# Patient Record
Sex: Female | Born: 1935 | Race: White | Hispanic: No | Marital: Married | Smoking: Never smoker
Health system: Southern US, Community
[De-identification: ages and names within clinical notes are randomized; demographics above are authoritative.]

## PROBLEM LIST (undated history)

## (undated) DIAGNOSIS — E039 Hypothyroidism, unspecified: Secondary | ICD-10-CM

## (undated) DIAGNOSIS — I209 Angina pectoris, unspecified: Secondary | ICD-10-CM

## (undated) DIAGNOSIS — A809 Acute poliomyelitis, unspecified: Secondary | ICD-10-CM

## (undated) DIAGNOSIS — T8859XA Other complications of anesthesia, initial encounter: Secondary | ICD-10-CM

## (undated) DIAGNOSIS — M1990 Unspecified osteoarthritis, unspecified site: Secondary | ICD-10-CM

## (undated) DIAGNOSIS — M940 Chondrocostal junction syndrome [Tietze]: Secondary | ICD-10-CM

## (undated) DIAGNOSIS — C801 Malignant (primary) neoplasm, unspecified: Secondary | ICD-10-CM

## (undated) DIAGNOSIS — Z923 Personal history of irradiation: Secondary | ICD-10-CM

## (undated) DIAGNOSIS — M6289 Other specified disorders of muscle: Secondary | ICD-10-CM

## (undated) DIAGNOSIS — T4145XA Adverse effect of unspecified anesthetic, initial encounter: Secondary | ICD-10-CM

## (undated) DIAGNOSIS — G4750 Parasomnia, unspecified: Secondary | ICD-10-CM

## (undated) DIAGNOSIS — H269 Unspecified cataract: Secondary | ICD-10-CM

## (undated) DIAGNOSIS — F419 Anxiety disorder, unspecified: Secondary | ICD-10-CM

## (undated) HISTORY — PX: CATARACT EXTRACTION: SUR2

## (undated) HISTORY — PX: ABDOMINAL HYSTERECTOMY: SHX81

## (undated) HISTORY — PX: BREAST SURGERY: SHX581

## (undated) HISTORY — DX: Acute poliomyelitis, unspecified: A80.9

## (undated) HISTORY — PX: OTHER SURGICAL HISTORY: SHX169

## (undated) HISTORY — DX: Other specified disorders of muscle: M62.89

## (undated) HISTORY — DX: Personal history of irradiation: Z92.3

## (undated) HISTORY — DX: Anxiety disorder, unspecified: F41.9

## (undated) HISTORY — PX: TONSILLECTOMY: SUR1361

## (undated) HISTORY — DX: Unspecified cataract: H26.9

## (undated) HISTORY — PX: EYE SURGERY: SHX253

## (undated) HISTORY — DX: Parasomnia, unspecified: G47.50

---
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
## 1986-12-01 HISTORY — PX: LEG SURGERY: SHX1003

## 1998-06-15 ENCOUNTER — Ambulatory Visit (HOSPITAL_COMMUNITY): Admission: RE | Admit: 1998-06-15 | Discharge: 1998-06-15 | Payer: Self-pay | Admitting: Obstetrics & Gynecology

## 1998-09-18 ENCOUNTER — Ambulatory Visit (HOSPITAL_COMMUNITY): Admission: RE | Admit: 1998-09-18 | Discharge: 1998-09-18 | Payer: Self-pay | Admitting: Family Medicine

## 1998-10-09 ENCOUNTER — Other Ambulatory Visit: Admission: RE | Admit: 1998-10-09 | Discharge: 1998-10-09 | Payer: Self-pay | Admitting: Obstetrics and Gynecology

## 1999-01-08 ENCOUNTER — Ambulatory Visit (HOSPITAL_BASED_OUTPATIENT_CLINIC_OR_DEPARTMENT_OTHER): Admission: RE | Admit: 1999-01-08 | Discharge: 1999-01-08 | Payer: Self-pay | Admitting: Orthopaedic Surgery

## 1999-01-18 ENCOUNTER — Encounter: Admission: RE | Admit: 1999-01-18 | Discharge: 1999-03-25 | Payer: Self-pay | Admitting: Orthopaedic Surgery

## 1999-09-23 ENCOUNTER — Ambulatory Visit (HOSPITAL_COMMUNITY): Admission: RE | Admit: 1999-09-23 | Discharge: 1999-09-23 | Payer: Self-pay | Admitting: Family Medicine

## 1999-10-09 ENCOUNTER — Other Ambulatory Visit: Admission: RE | Admit: 1999-10-09 | Discharge: 1999-10-09 | Payer: Self-pay | Admitting: Obstetrics and Gynecology

## 1999-11-06 ENCOUNTER — Encounter: Payer: Self-pay | Admitting: Gastroenterology

## 1999-11-06 ENCOUNTER — Encounter: Admission: RE | Admit: 1999-11-06 | Discharge: 1999-11-06 | Payer: Self-pay | Admitting: Gastroenterology

## 2000-03-12 ENCOUNTER — Encounter: Payer: Self-pay | Admitting: Gastroenterology

## 2000-03-12 ENCOUNTER — Ambulatory Visit (HOSPITAL_COMMUNITY): Admission: RE | Admit: 2000-03-12 | Discharge: 2000-03-12 | Payer: Self-pay | Admitting: Gastroenterology

## 2000-03-12 ENCOUNTER — Encounter (INDEPENDENT_AMBULATORY_CARE_PROVIDER_SITE_OTHER): Payer: Self-pay | Admitting: Specialist

## 2000-10-07 ENCOUNTER — Ambulatory Visit (HOSPITAL_COMMUNITY): Admission: RE | Admit: 2000-10-07 | Discharge: 2000-10-07 | Payer: Self-pay | Admitting: Family Medicine

## 2001-01-05 ENCOUNTER — Other Ambulatory Visit: Admission: RE | Admit: 2001-01-05 | Discharge: 2001-01-05 | Payer: Self-pay | Admitting: Obstetrics and Gynecology

## 2001-07-16 ENCOUNTER — Emergency Department (HOSPITAL_COMMUNITY): Admission: EM | Admit: 2001-07-16 | Discharge: 2001-07-16 | Payer: Self-pay

## 2001-10-12 ENCOUNTER — Ambulatory Visit (HOSPITAL_COMMUNITY): Admission: RE | Admit: 2001-10-12 | Discharge: 2001-10-12 | Payer: Self-pay | Admitting: Obstetrics and Gynecology

## 2001-10-12 ENCOUNTER — Encounter: Payer: Self-pay | Admitting: Obstetrics and Gynecology

## 2002-01-05 ENCOUNTER — Other Ambulatory Visit: Admission: RE | Admit: 2002-01-05 | Discharge: 2002-01-05 | Payer: Self-pay | Admitting: Obstetrics and Gynecology

## 2002-10-18 ENCOUNTER — Encounter: Payer: Self-pay | Admitting: Obstetrics and Gynecology

## 2002-10-18 ENCOUNTER — Ambulatory Visit (HOSPITAL_COMMUNITY): Admission: RE | Admit: 2002-10-18 | Discharge: 2002-10-18 | Payer: Self-pay | Admitting: Obstetrics and Gynecology

## 2003-02-15 ENCOUNTER — Other Ambulatory Visit: Admission: RE | Admit: 2003-02-15 | Discharge: 2003-02-15 | Payer: Self-pay | Admitting: Obstetrics and Gynecology

## 2003-09-04 ENCOUNTER — Ambulatory Visit (HOSPITAL_COMMUNITY): Admission: RE | Admit: 2003-09-04 | Discharge: 2003-09-04 | Payer: Self-pay | Admitting: Geriatric Medicine

## 2003-09-04 ENCOUNTER — Encounter: Payer: Self-pay | Admitting: Geriatric Medicine

## 2003-10-09 ENCOUNTER — Encounter: Admission: RE | Admit: 2003-10-09 | Discharge: 2003-10-09 | Payer: Self-pay | Admitting: Geriatric Medicine

## 2003-11-08 ENCOUNTER — Ambulatory Visit (HOSPITAL_COMMUNITY): Admission: RE | Admit: 2003-11-08 | Discharge: 2003-11-08 | Payer: Self-pay | Admitting: Geriatric Medicine

## 2004-02-29 ENCOUNTER — Other Ambulatory Visit: Admission: RE | Admit: 2004-02-29 | Discharge: 2004-02-29 | Payer: Self-pay | Admitting: Obstetrics and Gynecology

## 2004-11-12 ENCOUNTER — Ambulatory Visit (HOSPITAL_COMMUNITY): Admission: RE | Admit: 2004-11-12 | Discharge: 2004-11-12 | Payer: Self-pay | Admitting: Geriatric Medicine

## 2005-02-09 ENCOUNTER — Encounter: Admission: RE | Admit: 2005-02-09 | Discharge: 2005-02-09 | Payer: Self-pay | Admitting: Geriatric Medicine

## 2005-11-19 ENCOUNTER — Ambulatory Visit (HOSPITAL_COMMUNITY): Admission: RE | Admit: 2005-11-19 | Discharge: 2005-11-19 | Payer: Self-pay | Admitting: Geriatric Medicine

## 2006-05-20 ENCOUNTER — Other Ambulatory Visit: Admission: RE | Admit: 2006-05-20 | Discharge: 2006-05-20 | Payer: Self-pay | Admitting: Obstetrics and Gynecology

## 2006-12-09 ENCOUNTER — Ambulatory Visit (HOSPITAL_COMMUNITY): Admission: RE | Admit: 2006-12-09 | Discharge: 2006-12-09 | Payer: Self-pay | Admitting: Geriatric Medicine

## 2007-12-14 ENCOUNTER — Ambulatory Visit (HOSPITAL_COMMUNITY): Admission: RE | Admit: 2007-12-14 | Discharge: 2007-12-14 | Payer: Self-pay | Admitting: Obstetrics and Gynecology

## 2008-04-05 ENCOUNTER — Other Ambulatory Visit: Admission: RE | Admit: 2008-04-05 | Discharge: 2008-04-05 | Payer: Self-pay | Admitting: Obstetrics and Gynecology

## 2009-01-16 ENCOUNTER — Ambulatory Visit (HOSPITAL_COMMUNITY): Admission: RE | Admit: 2009-01-16 | Discharge: 2009-01-16 | Payer: Self-pay | Admitting: Geriatric Medicine

## 2009-05-07 ENCOUNTER — Ambulatory Visit: Payer: Self-pay | Admitting: Obstetrics and Gynecology

## 2010-01-22 ENCOUNTER — Ambulatory Visit (HOSPITAL_COMMUNITY): Admission: RE | Admit: 2010-01-22 | Discharge: 2010-01-22 | Payer: Self-pay | Admitting: Geriatric Medicine

## 2011-01-15 ENCOUNTER — Other Ambulatory Visit: Payer: Self-pay | Admitting: Obstetrics and Gynecology

## 2011-01-15 DIAGNOSIS — Z1231 Encounter for screening mammogram for malignant neoplasm of breast: Secondary | ICD-10-CM

## 2011-01-28 ENCOUNTER — Ambulatory Visit (HOSPITAL_COMMUNITY)
Admission: RE | Admit: 2011-01-28 | Discharge: 2011-01-28 | Disposition: A | Discharge status: Home / Self Care | Payer: Medicare Other | Source: Ambulatory Visit | Attending: Obstetrics and Gynecology | Admitting: Obstetrics and Gynecology

## 2011-01-28 ENCOUNTER — Encounter (HOSPITAL_COMMUNITY): Payer: Self-pay

## 2011-01-28 DIAGNOSIS — Z1231 Encounter for screening mammogram for malignant neoplasm of breast: Secondary | ICD-10-CM | POA: Insufficient documentation

## 2011-03-06 ENCOUNTER — Ambulatory Visit: Payer: PRIVATE HEALTH INSURANCE | Admitting: Physical Therapy

## 2011-03-11 ENCOUNTER — Ambulatory Visit: Payer: PRIVATE HEALTH INSURANCE | Admitting: Physical Therapy

## 2011-03-17 ENCOUNTER — Ambulatory Visit: Payer: PRIVATE HEALTH INSURANCE | Admitting: Physical Therapy

## 2012-02-09 ENCOUNTER — Other Ambulatory Visit (HOSPITAL_COMMUNITY): Payer: Self-pay | Admitting: Geriatric Medicine

## 2012-02-09 DIAGNOSIS — Z1231 Encounter for screening mammogram for malignant neoplasm of breast: Secondary | ICD-10-CM

## 2012-03-03 ENCOUNTER — Ambulatory Visit (HOSPITAL_COMMUNITY)
Admission: RE | Admit: 2012-03-03 | Discharge: 2012-03-03 | Disposition: A | Discharge status: Home / Self Care | Payer: Medicare Other | Source: Ambulatory Visit | Attending: Geriatric Medicine | Admitting: Geriatric Medicine

## 2012-03-03 DIAGNOSIS — Z1231 Encounter for screening mammogram for malignant neoplasm of breast: Secondary | ICD-10-CM | POA: Insufficient documentation

## 2013-01-27 ENCOUNTER — Other Ambulatory Visit (HOSPITAL_COMMUNITY): Payer: Self-pay | Admitting: Geriatric Medicine

## 2013-01-27 DIAGNOSIS — Z1231 Encounter for screening mammogram for malignant neoplasm of breast: Secondary | ICD-10-CM

## 2013-03-02 ENCOUNTER — Ambulatory Visit: Payer: Self-pay | Admitting: Gynecology

## 2013-03-09 ENCOUNTER — Ambulatory Visit (HOSPITAL_COMMUNITY): Payer: Medicare Other

## 2013-03-18 ENCOUNTER — Ambulatory Visit (HOSPITAL_COMMUNITY)
Admission: RE | Admit: 2013-03-18 | Discharge: 2013-03-18 | Disposition: A | Discharge status: Home / Self Care | Payer: Medicare Other | Source: Ambulatory Visit | Attending: Geriatric Medicine | Admitting: Geriatric Medicine

## 2013-03-18 DIAGNOSIS — Z1231 Encounter for screening mammogram for malignant neoplasm of breast: Secondary | ICD-10-CM | POA: Insufficient documentation

## 2013-03-23 ENCOUNTER — Ambulatory Visit (INDEPENDENT_AMBULATORY_CARE_PROVIDER_SITE_OTHER): Payer: Medicare Other | Admitting: Gynecology

## 2013-03-23 ENCOUNTER — Encounter: Payer: Self-pay | Admitting: Gynecology

## 2013-03-23 ENCOUNTER — Ambulatory Visit (INDEPENDENT_AMBULATORY_CARE_PROVIDER_SITE_OTHER): Payer: Medicare Other

## 2013-03-23 VITALS — BP 128/86 | Ht 65.0 in | Wt 168.0 lb

## 2013-03-23 DIAGNOSIS — N949 Unspecified condition associated with female genital organs and menstrual cycle: Secondary | ICD-10-CM

## 2013-03-23 DIAGNOSIS — D259 Leiomyoma of uterus, unspecified: Secondary | ICD-10-CM

## 2013-03-23 DIAGNOSIS — N83339 Acquired atrophy of ovary and fallopian tube, unspecified side: Secondary | ICD-10-CM

## 2013-03-23 DIAGNOSIS — M949 Disorder of cartilage, unspecified: Secondary | ICD-10-CM

## 2013-03-23 DIAGNOSIS — M898X8 Other specified disorders of bone, other site: Secondary | ICD-10-CM | POA: Insufficient documentation

## 2013-03-23 DIAGNOSIS — N839 Noninflammatory disorder of ovary, fallopian tube and broad ligament, unspecified: Secondary | ICD-10-CM

## 2013-03-23 DIAGNOSIS — M899 Disorder of bone, unspecified: Secondary | ICD-10-CM

## 2013-03-23 DIAGNOSIS — M8580 Other specified disorders of bone density and structure, unspecified site: Secondary | ICD-10-CM | POA: Insufficient documentation

## 2013-03-23 DIAGNOSIS — M8530 Osteitis condensans, unspecified site: Secondary | ICD-10-CM

## 2013-03-23 DIAGNOSIS — G8929 Other chronic pain: Secondary | ICD-10-CM

## 2013-03-23 DIAGNOSIS — D252 Subserosal leiomyoma of uterus: Secondary | ICD-10-CM

## 2013-03-23 DIAGNOSIS — M869 Osteomyelitis, unspecified: Secondary | ICD-10-CM

## 2013-03-23 DIAGNOSIS — N854 Malposition of uterus: Secondary | ICD-10-CM

## 2013-03-23 LAB — URINALYSIS W MICROSCOPIC + REFLEX CULTURE
Bilirubin Urine: NEGATIVE
Casts: NONE SEEN
Crystals: NONE SEEN
Glucose, UA: NEGATIVE mg/dL
Ketones, ur: NEGATIVE mg/dL
Leukocytes, UA: NEGATIVE
Nitrite: NEGATIVE
Protein, ur: NEGATIVE mg/dL
Specific Gravity, Urine: 1.025 (ref 1.005–1.030)
Urobilinogen, UA: 0.2 mg/dL (ref 0.0–1.0)
WBC, UA: NONE SEEN WBC/hpf (ref <3)
pH: 5.5 (ref 5.0–8.0)

## 2013-03-23 NOTE — Patient Instructions (Addendum)
Osteitis Pubis:  Osteitis pubis is defined as an idiopathic, inflammatory disease involving the pubic symphysis and surrounding structures.  Osteitis pubis is common among athletes but can occur in non-athletes, particularly in those with a history of rheumatologic conditions (eg, reactive arthritis, spondyloarthropathies), pregnancy, pelvic trauma, or pelvic surgery. The clinical presentation of a patient with osteitis pubis can vary but generally involves the insidious onset of pelvic pain in the absence of systemic symptoms. Osteitis pubis can be confused with osteomyelitis of the pubic symphysis. Osteomyelitis is more likely to present with acute onset of severe pain and fever.The diagnosis of osteitis pubis is often determined by history and physical examination alone. The gradual onset of pelvic discomfort, in combination with tenderness over the pubic symphysis or pain with resisted adductor testing  is sufficient for the diagnosis of osteitis pubis. Blood testing, diagnostic imaging, and biopsy are adjunctive tests that are performed when the diagnosis is uncertain after history and physical examination.The approach to management of osteitis pubis should be stepwise, going from least invasive to most invasive. In patients with osteitis pubis, we suggest initial treatment with conservative measures, including relative rest (refraining from activities that induce pelvic pain), ice, NSAIDs, and physical rehabilitation  If these conservative measures fail, we suggest administration of glucocorticoid injections of the pubic symphysis rather than an oral glucocorticoid taper  Surgical management is reserved for patients with osteitis pubis refractory to medical treatment. Curettage of the symphysis is considered to be the most effective and well-tolerated surgical treatment option.

## 2013-03-23 NOTE — Progress Notes (Signed)
Patient is an 79 year old who presented to the office today stating for the past 2-3 months she has suprapubic pain that comes and goes. She does not describe it as debilitating but improves on ambulation. She has not been seen the office for quite some time. Her primary physician is Dr. Corbin Ade. Review of patient's records indicated that 2010 she had a bone density study that demonstrated osteopenia with her lowest T score was at the [facility] with a value of -1.3. Also review of her record and indicated that she had been on Evista for several years and then was switched to Actonel for which she took only for 2 years. When she found out that her bone density study had improved she decided she wanted discontinue the antiresorptive agent.  Patient denies any recent injury or, or any unusual exertion to cause this suprapubic discomfort. She was not in any distress today.  Exam: Abdomen: Soft nontender no rebound or guarding Pelvic exam: Patient was noted to be tender on the mons pubis area but no rebound or guarding Bartholin urethra Skene glands: Within normal limits Vagina: Atrophic changes Bimanual exam no palpable masses or tenderness only on the pubic symphysis. Uterus was slightly retroverted. Adnexa: No palpable masses or tenderness Rectal exam: Unremarkable  Urinalysis: Negative  Ultrasound uterus measured 5.8 x 5.9 x 3.7 cm with endometrial stripe of 1.2 mm. Patient with persistent fundal fibroid unchanged from 2009 with a measurement 5.1 x 4.1 x 4.8 cm. Left ovary was atrophic right ovary had a thin-walled avascular cyst measuring 1.1 x 1.0 cm. There was no fluid in the cul-de-sac.  Assessment/plan: Osteitis pubis recommended rest and apply ice to area when necessary and take NSAIDs as needed. The symptoms persist or fax her quality of life she will followup with her rheumatologist she may want offer glucocorticoid injection and or MRI. I believe her osteitis pubis may be attributed to  her osteoarthritis. Patient was reminded she needs a gynecological exam next year. We also discussed that she was overdue for bone density study and she states that she is not interested. We also discussed followup next year with an ultrasound at time of annual exam and she states that she will think about it. She did have a mammogram this year which was normal. Literature information on osteitis pubis was provided.

## 2013-07-08 ENCOUNTER — Other Ambulatory Visit: Payer: Self-pay | Admitting: Neurology

## 2013-07-08 NOTE — Telephone Encounter (Signed)
Rx signed and faxed.

## 2014-01-11 ENCOUNTER — Other Ambulatory Visit: Payer: Self-pay | Admitting: Neurology

## 2014-01-12 ENCOUNTER — Other Ambulatory Visit: Payer: Self-pay | Admitting: Neurology

## 2014-01-12 NOTE — Telephone Encounter (Signed)
Rx signed and faxed.

## 2014-01-12 NOTE — Telephone Encounter (Signed)
Patient calling to state that pharmacy is still waiting on our response for Clonazepam refill, states she only has 3 pills left.

## 2014-01-16 NOTE — Telephone Encounter (Signed)
Prescription faxed to pharmacy.  Will close.

## 2014-02-23 ENCOUNTER — Other Ambulatory Visit (HOSPITAL_COMMUNITY): Payer: Self-pay | Admitting: Geriatric Medicine

## 2014-02-23 DIAGNOSIS — Z1231 Encounter for screening mammogram for malignant neoplasm of breast: Secondary | ICD-10-CM

## 2014-03-20 ENCOUNTER — Ambulatory Visit (HOSPITAL_COMMUNITY)
Admission: RE | Admit: 2014-03-20 | Discharge: 2014-03-20 | Disposition: A | Discharge status: Home / Self Care | Payer: Medicare Other | Source: Ambulatory Visit | Attending: Geriatric Medicine | Admitting: Geriatric Medicine

## 2014-03-20 DIAGNOSIS — Z1231 Encounter for screening mammogram for malignant neoplasm of breast: Secondary | ICD-10-CM

## 2014-03-22 ENCOUNTER — Ambulatory Visit: Payer: Medicare Other | Admitting: Neurology

## 2014-12-08 ENCOUNTER — Encounter (HOSPITAL_COMMUNITY): Payer: Self-pay

## 2014-12-08 ENCOUNTER — Emergency Department (HOSPITAL_COMMUNITY)
Admission: EM | Admit: 2014-12-08 | Discharge: 2014-12-08 | Disposition: A | Discharge status: Home / Self Care | Payer: PPO | Attending: Emergency Medicine | Admitting: Emergency Medicine

## 2014-12-08 ENCOUNTER — Emergency Department (HOSPITAL_COMMUNITY): Payer: PPO

## 2014-12-08 DIAGNOSIS — R0789 Other chest pain: Secondary | ICD-10-CM | POA: Diagnosis not present

## 2014-12-08 DIAGNOSIS — F419 Anxiety disorder, unspecified: Secondary | ICD-10-CM | POA: Insufficient documentation

## 2014-12-08 DIAGNOSIS — R079 Chest pain, unspecified: Secondary | ICD-10-CM | POA: Diagnosis present

## 2014-12-08 DIAGNOSIS — Z79899 Other long term (current) drug therapy: Secondary | ICD-10-CM | POA: Diagnosis not present

## 2014-12-08 DIAGNOSIS — R110 Nausea: Secondary | ICD-10-CM | POA: Insufficient documentation

## 2014-12-08 DIAGNOSIS — G8929 Other chronic pain: Secondary | ICD-10-CM | POA: Diagnosis not present

## 2014-12-08 DIAGNOSIS — Z8612 Personal history of poliomyelitis: Secondary | ICD-10-CM | POA: Insufficient documentation

## 2014-12-08 DIAGNOSIS — M549 Dorsalgia, unspecified: Secondary | ICD-10-CM | POA: Diagnosis not present

## 2014-12-08 LAB — HEPATIC FUNCTION PANEL
ALT: 52 U/L — AB (ref 0–35)
AST: 37 U/L (ref 0–37)
Albumin: 4 g/dL (ref 3.5–5.2)
Alkaline Phosphatase: 75 U/L (ref 39–117)
BILIRUBIN DIRECT: 0.1 mg/dL (ref 0.0–0.3)
BILIRUBIN TOTAL: 0.7 mg/dL (ref 0.3–1.2)
Indirect Bilirubin: 0.6 mg/dL (ref 0.3–0.9)
TOTAL PROTEIN: 7.1 g/dL (ref 6.0–8.3)

## 2014-12-08 LAB — CBC
HEMATOCRIT: 45.6 % (ref 36.0–46.0)
HEMOGLOBIN: 15.2 g/dL — AB (ref 12.0–15.0)
MCH: 29.7 pg (ref 26.0–34.0)
MCHC: 33.3 g/dL (ref 30.0–36.0)
MCV: 89.1 fL (ref 78.0–100.0)
PLATELETS: 210 10*3/uL (ref 150–400)
RBC: 5.12 MIL/uL — ABNORMAL HIGH (ref 3.87–5.11)
RDW: 13.2 % (ref 11.5–15.5)
WBC: 4.3 10*3/uL (ref 4.0–10.5)

## 2014-12-08 LAB — BASIC METABOLIC PANEL
ANION GAP: 5 (ref 5–15)
BUN: 17 mg/dL (ref 6–23)
CO2: 28 mmol/L (ref 19–32)
Calcium: 9.3 mg/dL (ref 8.4–10.5)
Chloride: 105 mEq/L (ref 96–112)
Creatinine, Ser: 0.92 mg/dL (ref 0.50–1.10)
GFR, EST AFRICAN AMERICAN: 64 mL/min — AB (ref >90)
GFR, EST NON AFRICAN AMERICAN: 56 mL/min — AB (ref >90)
GLUCOSE: 89 mg/dL (ref 70–99)
Potassium: 3.5 mmol/L (ref 3.5–5.1)
Sodium: 138 mmol/L (ref 135–145)

## 2014-12-08 LAB — I-STAT TROPONIN, ED
Troponin i, poc: 0 ng/mL (ref 0.00–0.08)
Troponin i, poc: 0 ng/mL (ref 0.00–0.08)

## 2014-12-08 MED ORDER — ACETAMINOPHEN 325 MG PO TABS
650.0000 mg | ORAL_TABLET | Freq: Once | ORAL | Status: AC
  Administered 2014-12-08: 650 mg via ORAL
  Filled 2014-12-08: qty 2

## 2014-12-08 NOTE — ED Provider Notes (Signed)
CSN: [id number]      Arrival date & time 12/08/14  0419 History   First MD Initiated Contact with Patient 12/08/14 0421     Chief Complaint  Patient presents with  . Chest Pain     (Consider location/radiation/quality/duration/timing/severity/associated sxs/prior Treatment) The history is provided by the patient.  Teresa Norris is a 81 y.o. female hx of polio, anxiety, costochondritis here presenting with chest pain. She has chronic substernal chest pain from costochondritis. She had an episode of chest pain around 11 pm. She took 2 tylenols and placed heat pack but didn't resolve as it usually does. She went to bed and then woke up with more pain. It is associated with nausea. Has chronic back pain that is not sharp. Denies abdominal pain. Denies hx of CAD or previous stents.   Past Medical History  Diagnosis Date  . Parasomnia     Night terrors  . Polio     At age 82 or 5 after a tonsillectomy  . Muscle fatigue     Post polio  . Anxiety   . Cataract     Both eyes   Past Surgical History  Procedure Laterality Date  . Eye surgery Bilateral Lt 11/12   Rt 12/12    CATARACT  . Leg surgery Left 1988  . Foot surgery  1938  . Breast surgery      BENIGN NODES  . 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 Cancer Brother   . Parkinson's disease Father   . Acute myelogenous leukemia     History  Substance Use Topics  . Smoking status: Never Smoker   . Smokeless tobacco: Never Used  . Alcohol Use: 2.0 - 2.5 oz/week    4-5 drink(s) per week     Comment: WINE    OB History    Gravida Para Term Preterm AB TAB SAB Ectopic Multiple Living   4 3   1  1   3      Review of Systems  Cardiovascular: Positive for chest pain.  All other systems reviewed and are negative.     Allergies  Aspirin  Home Medications   Prior to Admission medications   Medication Sig Start Date End Date Taking? Authorizing Provider   acetaminophen (TYLENOL) 500 MG tablet Take 500-1,000 mg by mouth every 6 (six) hours as needed for mild pain.   Yes Historical Provider, MD  clonazePAM (KLONOPIN) 0.5 MG tablet TAKE 1 TABLET BY MOUTH AT NIGHT   Yes Larey Seat, MD  levothyroxine (SYNTHROID, LEVOTHROID) 100 MCG tablet Take 100 mcg by mouth daily before breakfast.   Yes Historical Provider, MD   BP 124/69 mmHg  Pulse 59  Temp(Src) 97.8 F (36.6 C) (Oral)  Resp 15  Ht 5\' 5"  (1.651 m)  Wt 165 lb (74.844 kg)  BMI 27.46 kg/m2  SpO2 93% Physical Exam  Constitutional: She is oriented to person, place, and time. She appears well-nourished.  Uncomfortable   HENT:  Head: Normocephalic.  Mouth/Throat: Oropharynx is clear and moist.  Eyes: Conjunctivae are normal. Pupils are equal, round, and reactive to light.  Neck: Normal range of motion. Neck supple.  Cardiovascular: Normal rate, regular rhythm and normal heart sounds.   Pulmonary/Chest: Effort normal and breath sounds normal. No respiratory distress. She has no wheezes. She has no rales.  Reproducible chest tenderness   Abdominal: Soft. Bowel sounds are normal. She exhibits  no distension. There is no tenderness. There is no rebound and no guarding.  Musculoskeletal: Normal range of motion. She exhibits no edema or tenderness.  Neurological: She is alert and oriented to person, place, and time. No cranial nerve deficit. Coordination normal.  Skin: Skin is warm and dry.  Psychiatric: She has a normal mood and affect. Her behavior is normal. Judgment and thought content normal.  Nursing note and vitals reviewed.   ED Course  Procedures (including critical care time) Labs Review Labs Reviewed  CBC - Abnormal; Notable for the following:    RBC 5.12 (*)    Hemoglobin 15.2 (*)    All other components within normal limits  BASIC METABOLIC PANEL - Abnormal; Notable for the following:    GFR calc non Af Amer 56 (*)    GFR calc Af Amer 64 (*)    All other components  within normal limits  HEPATIC FUNCTION PANEL - Abnormal; Notable for the following:    ALT 52 (*)    All other components within normal limits  I-STAT TROPOININ, ED    Imaging Review Dg Chest 2 View  12/08/2014   CLINICAL DATA:  Central chest pain.  History of costochondritis.  EXAM: CHEST  2 VIEW  COMPARISON:  CT chest 10/09/2003  FINDINGS: Normal heart size and pulmonary vascularity. Hyperinflation suggesting emphysematous changes. Central interstitial changes and peribronchial thickening suggesting chronic bronchitis. Hazy increased density in the right cardiophrenic angle consistent with cardiac fat pad shown on previous CT scan. No focal airspace disease or consolidation in the lungs. No blunting of costophrenic angles. No pneumothorax.  IMPRESSION: Emphysematous and chronic bronchitic changes in the lungs. No evidence of active pulmonary disease.   Electronically Signed   By: Lucienne Capers M.D.   On: 12/08/2014 04:53     EKG Interpretation   Date/Time:  Friday December 08 2014 04:25:33 EST Ventricular Rate:  62 PR Interval:  215 QRS Duration: 86 QT Interval:  399 QTC Calculation: 405 R Axis:   -15 Text Interpretation:  Sinus rhythm Prolonged PR interval Borderline left  axis deviation Borderline T abnormalities, lateral leads No previous ECGs  available Confirmed by YAO  MD, DAVID ([id number]) on 12/08/2014 4:37:43 AM      MDM   Final diagnoses:  Chest pain    Teresa Norris is a 81 y.o. female here with chest pain. Consider costochondritis but also need to r/o ACS. Has back pain but I have low suspicion for dissection. If CXR showed no widened mediastinum will not need to get CT. I doubt PE. Will get delta trop, labs, CXR.   7 AM  Labs unremarkable. Trop neg x 1. Pain free. Signed out to Dr. Aline Brochure to repeat trop at 8:30 am. If neg, can d/c home.     Wandra Arthurs, MD 12/08/14 [phone]

## 2014-12-08 NOTE — ED Notes (Signed)
MD at bedside. DR Darl Householder

## 2014-12-08 NOTE — ED Notes (Signed)
Patient here by ems for chest pain, hx of costochondritis, sts feels like an irritation, radiating to back, took tylenol and used heating pad with no relief. Some nausea, recent URI and feeling weak, skin warm and dry. Given 324 of asa by ems and had 20 g LAC

## 2014-12-08 NOTE — Discharge Instructions (Signed)
Take motrin 400 mg every 6 hrs for pain.   Follow up with your doctor.   Return to ER if you have severe chest pain, shortness of breath.

## 2015-02-21 ENCOUNTER — Other Ambulatory Visit (HOSPITAL_COMMUNITY): Payer: Self-pay | Admitting: Geriatric Medicine

## 2015-02-21 DIAGNOSIS — Z1231 Encounter for screening mammogram for malignant neoplasm of breast: Secondary | ICD-10-CM

## 2015-03-28 ENCOUNTER — Ambulatory Visit (HOSPITAL_COMMUNITY)
Admission: RE | Admit: 2015-03-28 | Discharge: 2015-03-28 | Disposition: A | Discharge status: Home / Self Care | Payer: PPO | Source: Ambulatory Visit | Attending: Geriatric Medicine | Admitting: Geriatric Medicine

## 2015-03-28 DIAGNOSIS — Z1231 Encounter for screening mammogram for malignant neoplasm of breast: Secondary | ICD-10-CM | POA: Diagnosis not present

## 2015-12-05 DIAGNOSIS — R3129 Other microscopic hematuria: Secondary | ICD-10-CM | POA: Diagnosis not present

## 2015-12-05 DIAGNOSIS — N3941 Urge incontinence: Secondary | ICD-10-CM | POA: Diagnosis not present

## 2015-12-05 DIAGNOSIS — Z0000 Encounter for general adult medical examination without abnormal findings: Secondary | ICD-10-CM | POA: Diagnosis not present

## 2016-02-06 ENCOUNTER — Encounter: Payer: Self-pay | Admitting: Podiatry

## 2016-02-06 ENCOUNTER — Ambulatory Visit (INDEPENDENT_AMBULATORY_CARE_PROVIDER_SITE_OTHER): Payer: PPO | Admitting: Podiatry

## 2016-02-06 VITALS — BP 154/92 | HR 83 | Resp 14

## 2016-02-06 DIAGNOSIS — M19072 Primary osteoarthritis, left ankle and foot: Secondary | ICD-10-CM

## 2016-02-06 DIAGNOSIS — M129 Arthropathy, unspecified: Secondary | ICD-10-CM

## 2016-02-06 DIAGNOSIS — M898X9 Other specified disorders of bone, unspecified site: Secondary | ICD-10-CM | POA: Diagnosis not present

## 2016-02-06 NOTE — Progress Notes (Signed)
   Subjective:    Patient ID: Teresa Norris, female    DOB: [birth year], 82 y.o.   MRN: [id number]  HPIhis patient presents to the office with chief complaint of pain noted on the top of her left foot. She says the pain has been present for approximately 4 weeks and there is a little swelling noted. She states that she has had polio as a child and has worn braces as needed. She was in the office with her husband last visit and purchased pure stride insoles to help to support her foot. She says that she has been having increased pain in the last 4 weeks despite the Insole. She says she has been seen by her other doctor who told her he believes she is developed arthritis in her foot. She says she cannot take any anti-inflammatory medicines due to gastric bleeding. She presents the office today for an evaluation and treatment of this condition The patient presents here today with left foot pain.   Review of Systems  All other systems reviewed and are negative.      Objective:   Physical Exam GENERAL APPEARANCE: Alert, conversant. Appropriately groomed. No acute distress.  VASCULAR: Pedal pulses palpable at [facility] and PT right.  Pulses are not palpable left foot..  Capillary refill time is immediate to all digits,  Normal temperature gradient.  Digital hair growth is present bilateral  NEUROLOGIC: sensation is normal to 5.07 monofilament at 5/5 sites right.  LOPS left is absent.[place] touch is intact bilateral, Muscle strength normal.  MUSCULOSKELETAL: acceptable muscle strength, tone and stability bilateral.  Intrinsic muscluature intact bilateral.  Rectus appearance of foot and digits noted bilateral. Left foot dorsiflexes to perpendicular. Bony exostosis at medial aspect liz-frank B/L.  She has increased temperature and swelling noted left foot at base first metatarsal left foot.  DERMATOLOGIC: skin color, texture, and turgor are within normal limits.  No preulcerative lesions or ulcers  are seen, no  interdigital maceration noted.  No open lesions present.  Digital nails are asymptomatic. No drainage noted.         Assessment & Plan:  Exostosis B/L  Arthritis B/L  IE  Injection therapy left foot.  RTC 3 weeks     Gardiner Barefoot DPM

## 2016-03-06 ENCOUNTER — Ambulatory Visit: Payer: PPO | Admitting: Podiatry

## 2016-04-16 ENCOUNTER — Other Ambulatory Visit: Payer: Self-pay | Admitting: Obstetrics and Gynecology

## 2016-04-16 ENCOUNTER — Other Ambulatory Visit (HOSPITAL_COMMUNITY)
Admission: RE | Admit: 2016-04-16 | Discharge: 2016-04-16 | Disposition: A | Discharge status: Home / Self Care | Payer: PPO | Source: Ambulatory Visit | Attending: Obstetrics and Gynecology | Admitting: Obstetrics and Gynecology

## 2016-04-16 DIAGNOSIS — Z01419 Encounter for gynecological examination (general) (routine) without abnormal findings: Secondary | ICD-10-CM | POA: Diagnosis not present

## 2016-04-16 DIAGNOSIS — Z1151 Encounter for screening for human papillomavirus (HPV): Secondary | ICD-10-CM | POA: Insufficient documentation

## 2016-04-16 DIAGNOSIS — N950 Postmenopausal bleeding: Secondary | ICD-10-CM | POA: Diagnosis not present

## 2016-04-16 DIAGNOSIS — R1900 Intra-abdominal and pelvic swelling, mass and lump, unspecified site: Secondary | ICD-10-CM | POA: Diagnosis not present

## 2016-04-16 DIAGNOSIS — Z01411 Encounter for gynecological examination (general) (routine) with abnormal findings: Secondary | ICD-10-CM | POA: Insufficient documentation

## 2016-04-17 LAB — CYTOLOGY - PAP

## 2016-04-21 ENCOUNTER — Other Ambulatory Visit: Payer: Self-pay | Admitting: Obstetrics and Gynecology

## 2016-04-21 DIAGNOSIS — N950 Postmenopausal bleeding: Secondary | ICD-10-CM | POA: Diagnosis not present

## 2016-04-21 DIAGNOSIS — C541 Malignant neoplasm of endometrium: Secondary | ICD-10-CM | POA: Diagnosis not present

## 2016-04-21 DIAGNOSIS — D252 Subserosal leiomyoma of uterus: Secondary | ICD-10-CM | POA: Diagnosis not present

## 2016-05-02 ENCOUNTER — Ambulatory Visit: Payer: PPO | Attending: Gynecology | Admitting: Gynecology

## 2016-05-02 ENCOUNTER — Encounter: Payer: Self-pay | Admitting: Gynecology

## 2016-05-02 VITALS — BP 149/86 | HR 80 | Temp 97.9°F | Resp 20 | Ht 65.0 in | Wt 169.3 lb

## 2016-05-02 DIAGNOSIS — H269 Unspecified cataract: Secondary | ICD-10-CM | POA: Diagnosis not present

## 2016-05-02 DIAGNOSIS — F419 Anxiety disorder, unspecified: Secondary | ICD-10-CM | POA: Diagnosis not present

## 2016-05-02 DIAGNOSIS — M6289 Other specified disorders of muscle: Secondary | ICD-10-CM | POA: Insufficient documentation

## 2016-05-02 DIAGNOSIS — G4750 Parasomnia, unspecified: Secondary | ICD-10-CM | POA: Insufficient documentation

## 2016-05-02 DIAGNOSIS — C541 Malignant neoplasm of endometrium: Secondary | ICD-10-CM

## 2016-05-02 NOTE — Patient Instructions (Addendum)
Teresa Norris  05/02/2016   Your procedure is scheduled on: 05/06/16  Report to [facility]  Entrance take [facility]  elevators to 3rd floor to  [place] at 11:30 AM.  Call this number if you have problems the morning of surgery [phone]   Remember: ONLY 1 PERSON MAY GO WITH YOU TO SHORT STAY TO GET  READY MORNING OF [place].  Do not eat food or drink liquids :After Midnight.  You may have clear liquids until 7:30AM morning of surgery.   Take these medicines the morning of surgery with A SIP OF WATER: Levothyroxine (Synthroid), Liquifilm tears. DO NOT TAKE ANY DIABETIC MEDICATIONS DAY OF YOUR SURGERY                               You may not have any metal on your body including hair pins and              piercings  Do not wear jewelry, make-up, lotions, powders or perfumes, deodorant             Do not wear nail polish.  Do not shave  48 hours prior to surgery.            Do not bring valuables to the hospital. [place].  Contacts, dentures or bridgework may not be worn into surgery.  Leave suitcase in the car. After surgery it may be brought to your room.     Name and phone number of your driver:  Special Instructions: coughing and deep breathing exercises, leg exercises              Please read over the following fact sheets you were given: _____________________________________________________________________             [facility] - Preparing for Surgery Before surgery, you can play an important role.  Because skin is not sterile, your skin needs to be as free of germs as possible.  You can reduce the number of germs on your skin by washing with CHG (chlorahexidine gluconate) soap before surgery.  CHG is an antiseptic cleaner which kills germs and bonds with the skin to continue killing germs even after washing. Please DO NOT use if you have an allergy to CHG or antibacterial soaps.  If  your skin becomes reddened/irritated stop using the CHG and inform your nurse when you arrive at Short Stay. Do not shave (including legs and underarms) for at least 48 hours prior to the first CHG shower.  You may shave your face/neck. Please follow these instructions carefully:  1.  Shower with CHG Soap the night before surgery and the  morning of Surgery.  2.  If you choose to wash your hair, wash your hair first as usual with your  normal  shampoo.  3.  After you shampoo, rinse your hair and body thoroughly to remove the  shampoo.                           4.  Use CHG as you would any other liquid soap.  You can apply chg directly  to the skin and wash  Gently with a scrungie or clean washcloth.  5.  Apply the CHG Soap to your body ONLY FROM THE NECK DOWN.   Do not use on face/ open                           Wound or open sores. Avoid contact with eyes, ears mouth and genitals (private parts).                       Wash face,  Genitals (private parts) with your normal soap.             6.  Wash thoroughly, paying special attention to the area where your surgery  will be performed.  7.  Thoroughly rinse your body with warm water from the neck down.  8.  DO NOT shower/wash with your normal soap after using and rinsing off  the CHG Soap.                9.  Pat yourself dry with a clean towel.            10.  Wear clean pajamas.            11.  Place clean sheets on your bed the night of your first shower and do not  sleep with pets. Day of Surgery : Do not apply any lotions/deodorants the morning of surgery.  Please wear clean clothes to the hospital/surgery center.  FAILURE TO FOLLOW THESE INSTRUCTIONS MAY RESULT IN THE CANCELLATION OF YOUR SURGERY PATIENT SIGNATURE_________________________________  NURSE SIGNATURE__________________________________  ________________________________________________________________________   Adam Phenix  An incentive  spirometer is a tool that can help keep your lungs clear and active. This tool measures how well you are filling your lungs with each breath. Taking long deep breaths may help reverse or decrease the chance of developing breathing (pulmonary) problems (especially infection) following:  A long period of time when you are unable to move or be active. BEFORE THE PROCEDURE   If the spirometer includes an indicator to show your best effort, your nurse or respiratory therapist will set it to a desired goal.  If possible, sit up straight or lean slightly forward. Try not to slouch.  Hold the incentive spirometer in an upright position. INSTRUCTIONS FOR USE   Sit on the edge of your bed if possible, or sit up as far as you can in bed or on a chair.  Hold the incentive spirometer in an upright position.  Breathe out normally.  Place the mouthpiece in your mouth and seal your lips tightly around it.  Breathe in slowly and as deeply as possible, raising the piston or the ball toward the top of the column.  Hold your breath for 3-5 seconds or for as long as possible. Allow the piston or ball to fall to the bottom of the column.  Remove the mouthpiece from your mouth and breathe out normally.  Rest for a few seconds and repeat Steps 1 through 7 at least 10 times every 1-2 hours when you are awake. Take your time and take a few normal breaths between deep breaths.  The spirometer may include an indicator to show your best effort. Use the indicator as a goal to work toward during each repetition.  After each set of 10 deep breaths, practice coughing to be sure your lungs are clear. If you have an incision (the cut made at the time of surgery),  support your incision when coughing by placing a pillow or rolled up towels firmly against it. Once you are able to get out of bed, walk around indoors and cough well. You may stop using the incentive spirometer when instructed by your caregiver.  RISKS AND  COMPLICATIONS  Take your time so you do not get dizzy or light-headed.  If you are in pain, you may need to take or ask for pain medication before doing incentive spirometry. It is harder to take a deep breath if you are having pain. AFTER USE  Rest and breathe slowly and easily.  It can be helpful to keep track of a log of your progress. Your caregiver can provide you with a simple table to help with this. If you are using the spirometer at home, follow these instructions: [place] IF:   You are having difficultly using the spirometer.  You have trouble using the spirometer as often as instructed.  Your pain medication is not giving enough relief while using the spirometer.  You develop fever of 100.5 F (38.1 C) or higher. SEEK IMMEDIATE MEDICAL CARE IF:   You cough up bloody sputum that had not been present before.  You develop fever of 102 F (38.9 C) or greater.  You develop worsening pain at or near the incision site. MAKE SURE YOU:   Understand these instructions.  Will watch your condition.  Will get help right away if you are not doing well or get worse. Document Released: 03/30/2007 Document Revised: 02/09/2012 Document Reviewed: 05/31/2007 ExitCare Patient Information 2014 [place].   ________________________________________________________________________    CLEAR LIQUID DIET   Foods Allowed                                                                     Foods Excluded  Coffee and tea, regular and decaf                             liquids that you cannot  Plain Jell-O in any flavor                                             see through such as: Fruit ices (not with fruit pulp)                                     milk, soups, orange juice  Iced Popsicles                                    All solid food Carbonated beverages, regular and diet                                    Cranberry, grape and apple juices Sports drinks like  Gatorade Lightly seasoned clear broth or consume(fat free) Sugar, honey syrup  Sample Menu Breakfast  Lunch                                     Supper Cranberry juice                    Beef broth                            Chicken broth Jell-O                                     Grape juice                           Apple juice Coffee or tea                        Jell-O                                      Popsicle                                                Coffee or tea                        Coffee or tea  _____________________________________________________________________   WHAT IS A BLOOD TRANSFUSION? Blood Transfusion Information  A transfusion is the replacement of blood or some of its parts. Blood is made up of multiple cells which provide different functions.  Red blood cells carry oxygen and are used for blood loss replacement.  White blood cells fight against infection.  Platelets control bleeding.  Plasma helps clot blood.  Other blood products are available for specialized needs, such as hemophilia or other clotting disorders. BEFORE THE TRANSFUSION  Who gives blood for transfusions?   Healthy volunteers who are fully evaluated to make sure their blood is safe. This is blood bank blood. Transfusion therapy is the safest it has ever been in the practice of medicine. Before blood is taken from a donor, a complete history is taken to make sure that person has no history of diseases nor engages in risky social behavior (examples are intravenous drug use or sexual activity with multiple partners). The donor's travel history is screened to minimize risk of transmitting infections, such as malaria. The donated blood is tested for signs of infectious diseases, such as HIV and hepatitis. The blood is then tested to be sure it is compatible with you in order to minimize the chance of a transfusion reaction. If you or a relative donates blood,  this is often done in anticipation of surgery and is not appropriate for emergency situations. It takes many days to process the donated blood. RISKS AND COMPLICATIONS Although transfusion therapy is very safe and saves many lives, the main dangers of transfusion include:   Getting an infectious disease.  Developing a transfusion reaction. This is an allergic reaction to something in the blood you were given. Every precaution is taken to prevent this. The decision to have  a blood transfusion has been considered carefully by your caregiver before blood is given. Blood is not given unless the benefits outweigh the risks. AFTER THE TRANSFUSION  Right after receiving a blood transfusion, you will usually feel much better and more energetic. This is especially true if your red blood cells have gotten low (anemic). The transfusion raises the level of the red blood cells which carry oxygen, and this usually causes an energy increase.  The nurse administering the transfusion will monitor you carefully for complications. HOME CARE INSTRUCTIONS  No special instructions are needed after a transfusion. You may find your energy is better. Speak with your caregiver about any limitations on activity for underlying diseases you may have. SEEK MEDICAL CARE IF:   Your condition is not improving after your transfusion.  You develop redness or irritation at the intravenous (IV) site. SEEK IMMEDIATE MEDICAL CARE IF:  Any of the following symptoms occur over the next 12 hours:  Shaking chills.  You have a temperature by mouth above 102 F (38.9 C), not controlled by medicine.  Chest, back, or muscle pain.  People around you feel you are not acting correctly or are confused.  Shortness of breath or difficulty breathing.  Dizziness and fainting.  You get a rash or develop hives.  You have a decrease in urine output.  Your urine turns a dark color or changes to pink, red, or brown. Any of the  following symptoms occur over the next 10 days:  You have a temperature by mouth above 102 F (38.9 C), not controlled by medicine.  Shortness of breath.  Weakness after normal activity.  The white part of the eye turns yellow (jaundice).  You have a decrease in the amount of urine or are urinating less often.  Your urine turns a dark color or changes to pink, red, or brown. Document Released: 11/14/2000 Document Revised: 02/09/2012 Document Reviewed: 07/03/2008 [facility] Patient Information 2014 [place], [place].  _______________________________________________________________________

## 2016-05-02 NOTE — Patient Instructions (Signed)
Preparing for your Surgery  Plan for surgery on June 6 with Dr. Everitt Amber at [place] will be scheduled for a robotic assisted total hysterectomy, bilateral salpingo-oophorectomy, sentinel lymph node biopsy.  Pre-operative Testing -You will receive a phone call from presurgical testing at [facility] to arrange for a pre-operative testing appointment before your surgery.  This appointment normally occurs one to two weeks before your scheduled surgery.   -Bring your insurance card, copy of an advanced directive if applicable, medication list  -At that visit, you will be asked to sign a consent for a possible blood transfusion in case a transfusion becomes necessary during surgery.  The need for a blood transfusion is rare but having consent is a necessary part of your care.     -You should not be taking blood thinners or aspirin at least ten days prior to surgery unless instructed by your surgeon.  Day Before Surgery at [place] will be asked to take in a light diet the day before surgery.  Avoid carbonated beverages.  You will be advised to have nothing to eat or drink after midnight the evening before.     Eat a light diet the day before surgery.  Examples including soups, broths, toast, yogurt, mashed potatoes.  Things to avoid include carbonated beverages (fizzy beverages), raw fruits and raw vegetables, or beans.    If your bowels are filled with gas, your surgeon will have difficulty visualizing your pelvic organs which increases your surgical risks.  Your role in recovery Your role is to become active as soon as directed by your doctor, while still giving yourself time to heal.  Rest when you feel tired. You will be asked to do the following in order to speed your recovery:  - Cough and breathe deeply. This helps toclear and expand your lungs and can prevent pneumonia. You may be given a spirometer to practice deep breathing. A staff member will  show you how to use the spirometer. - Do mild physical activity. Walking or moving your legs help your circulation and body functions return to normal. A staff member will help you when you try to walk and will provide you with simple exercises. Do not try to get up or walk alone the first time. - Actively manage your pain. Managing your pain lets you move in comfort. We will ask you to rate your pain on a scale of zero to 10. It is your responsibility to tell your doctor or nurse where and how much you hurt so your pain can be treated.  Special Considerations -If you are diabetic, you may be placed on insulin after surgery to have closer control over your blood sugars to promote healing and recovery.  This does not mean that you will be discharged on insulin.  If applicable, your oral antidiabetics will be resumed when you are tolerating a solid diet.  -Your final pathology results from surgery should be available by the Friday after surgery and the results will be relayed to you when available.  Blood Transfusion Information WHAT IS A BLOOD TRANSFUSION? A transfusion is the replacement of blood or some of its parts. Blood is made up of multiple cells which provide different functions.  Red blood cells carry oxygen and are used for blood loss replacement.  White blood cells fight against infection.  Platelets control bleeding.  Plasma helps clot blood.  Other blood products are available for specialized needs, such as hemophilia or other  clotting disorders. BEFORE THE TRANSFUSION  Who gives blood for transfusions?   You may be able to donate blood to be used at a later date on yourself (autologous donation).  Relatives can be asked to donate blood. This is generally not any safer than if you have received blood from a stranger. The same precautions are taken to ensure safety when a relative's blood is donated.  Healthy volunteers who are fully evaluated to make sure their blood is safe.  This is blood bank blood. Transfusion therapy is the safest it has ever been in the practice of medicine. Before blood is taken from a donor, a complete history is taken to make sure that person has no history of diseases nor engages in risky social behavior (examples are intravenous drug use or sexual activity with multiple partners). The donor's travel history is screened to minimize risk of transmitting infections, such as malaria. The donated blood is tested for signs of infectious diseases, such as HIV and hepatitis. The blood is then tested to be sure it is compatible with you in order to minimize the chance of a transfusion reaction. If you or a relative donates blood, this is often done in anticipation of surgery and is not appropriate for emergency situations. It takes many days to process the donated blood. RISKS AND COMPLICATIONS Although transfusion therapy is very safe and saves many lives, the main dangers of transfusion include:   Getting an infectious disease.  Developing a transfusion reaction. This is an allergic reaction to something in the blood you were given. Every precaution is taken to prevent this. The decision to have a blood transfusion has been considered carefully by your caregiver before blood is given. Blood is not given unless the benefits outweigh the risks.

## 2016-05-02 NOTE — Progress Notes (Signed)
Consult Note: Gyn-Onc   Teresa Norris 82 y.o. female  Chief Complaint  Patient presents with  . New Patient (Initial Visit)    Endometrial cancer    Assessment : Endometrial adenocarcinoma. Plan: The natural history of in utero cancer was discussed the patient and her family. I recommend she undergo robotic hysterectomy bilateral salpingo-oophorectomy and intraoperative frozen section to determine whether she has risk factors that would warrant lymphadenectomy.  The patient and family understand that Dr. Everitt Amber will be the primary surgeon. Surgery is scheduled for Tuesday, 05/06/2016.  The risks of surgery were reviewed including hemorrhage, infection, injury to adjacent viscera, anesthetic risks, and venous thromboembolic complications. All questions are answered.   HPI: 82 year old white female seen in consultation request of Dr. Simona Huh regarding management of a newly diagnosed endometrial carcinoma. Proctoscopy 6 weeks ago the patient had her first onset of abnormal bleeding and 40 years. This abated but approximately a month later she began bleeding again. She was then seen by Dr.Varnado who obtained an ultrasound showing a normal uterus and ovaries. Endometrial biopsy was interpreted as adenocarcinoma. The patient continued to have some light bleeding. She denies any pelvic pain.  The patient has no past gynecologic history.  She has a past history of polio as a child and she claims that she has prolonged slow recovery following anesthesia.  Review of Systems:10 point review of systems is negative except as noted in interval history.   Vitals: Blood pressure 149/86, pulse 80, temperature 97.9 F (36.6 C), temperature source Oral, resp. rate 20, height 5\' 5"  (1.651 m), weight 169 lb 4.8 oz (76.794 kg), SpO2 98 %.  Physical Exam: General : The patient is a healthy woman in no acute distress.  HEENT: normocephalic, extraoccular movements normal; neck is supple without  thyromegally  Lynphnodes: Supraclavicular and inguinal nodes not enlarged  Abdomen: Soft, non-tender, no ascites, no organomegally, no masses, no hernias  Pelvic:  EGBUS: Normal female  Vagina: Normal, no lesions , there is some blood in the vagina. Urethra and Bladder: Normal, non-tender  Cervix: Normal, no lesions are noted.  Uterus: Anterior normal shape size and consistency. Bi-manual examination: Non-tender; no adenxal masses or nodularity  Rectal: normal sphincter tone, no masses, no blood  Lower extremities: No edema or varicosities. Normal range of motion      Allergies  Allergen Reactions  . Aspirin Anaphylaxis    STOMACH PAIN/BLEEDING ULCERS    Past Medical History  Diagnosis Date  . Parasomnia     Night terrors  . Polio     At age 75 or 5 after a tonsillectomy  . Muscle fatigue     Post polio  . Anxiety   . Cataract     Both eyes    Past Surgical History  Procedure Laterality Date  . Eye surgery Bilateral Lt 11/12   Rt 12/12    CATARACT  . Leg surgery Left 1988  . Foot surgery  1938  . Breast surgery      BENIGN NODES  . 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 acetaminophen (TYLENOL) 500 MG tablet Take 500-1,000 mg by mouth every 6 (six) hours as needed for mild pain.    . clonazePAM (KLONOPIN) 0.5 MG tablet TAKE 1 TABLET BY MOUTH AT NIGHT 90 tablet 0  . levothyroxine (SYNTHROID, LEVOTHROID) 100 MCG tablet Take 100 mcg by mouth daily before breakfast.  No current facility-administered medications for this visit.    Social History   Social History  . Marital Status: Married    Spouse Name: [name]  . Number of Children: 3  . Years of Education: 14   Occupational History  . Not on file.   Social History Main Topics  . Smoking status: Never Smoker   . Smokeless tobacco: Never Used  . Alcohol Use: 2.0 - 2.5 oz/week    4-5 drink(s) per week     Comment: WINE    . Drug Use: No  . Sexual Activity: Not on file   Other Topics Concern  . Not on file   Social History Narrative   Patient is married [name]) and lives at home with her husband.   Patient has three children.   Patient is retired.   Patient has a college education.   Patient drinks caffeine once in a while.    Family History  Problem Relation Age of Onset  . Cancer Brother   . Parkinson's disease Father   . Acute myelogenous leukemia        CLARKE-PEARSON,Rosabel Sermeno L, MD 05/02/2016, 8:44 AM

## 2016-05-05 ENCOUNTER — Encounter (HOSPITAL_COMMUNITY)
Admission: RE | Admit: 2016-05-05 | Discharge: 2016-05-05 | Disposition: A | Discharge status: Home / Self Care | Payer: PPO | Source: Ambulatory Visit | Attending: Gynecologic Oncology | Admitting: Gynecologic Oncology

## 2016-05-05 ENCOUNTER — Ambulatory Visit (HOSPITAL_COMMUNITY)
Admission: RE | Admit: 2016-05-05 | Discharge: 2016-05-05 | Disposition: A | Discharge status: Home / Self Care | Payer: PPO | Source: Ambulatory Visit | Attending: Gynecologic Oncology | Admitting: Gynecologic Oncology

## 2016-05-05 ENCOUNTER — Encounter (HOSPITAL_COMMUNITY): Payer: Self-pay

## 2016-05-05 DIAGNOSIS — C541 Malignant neoplasm of endometrium: Secondary | ICD-10-CM

## 2016-05-05 DIAGNOSIS — Z01818 Encounter for other preprocedural examination: Secondary | ICD-10-CM | POA: Insufficient documentation

## 2016-05-05 HISTORY — DX: Adverse effect of unspecified anesthetic, initial encounter: T41.45XA

## 2016-05-05 HISTORY — DX: Other complications of anesthesia, initial encounter: T88.59XA

## 2016-05-05 HISTORY — DX: Malignant (primary) neoplasm, unspecified: C80.1

## 2016-05-05 HISTORY — DX: Unspecified osteoarthritis, unspecified site: M19.90

## 2016-05-05 HISTORY — DX: Chondrocostal junction syndrome (tietze): M94.0

## 2016-05-05 HISTORY — DX: Angina pectoris, unspecified: I20.9

## 2016-05-05 LAB — COMPREHENSIVE METABOLIC PANEL
ALT: 21 U/L (ref 14–54)
AST: 22 U/L (ref 15–41)
Albumin: 4.3 g/dL (ref 3.5–5.0)
Alkaline Phosphatase: 60 U/L (ref 38–126)
Anion gap: 6 (ref 5–15)
BUN: 20 mg/dL (ref 6–20)
CHLORIDE: 105 mmol/L (ref 101–111)
CO2: 28 mmol/L (ref 22–32)
Calcium: 9.5 mg/dL (ref 8.9–10.3)
Creatinine, Ser: 0.86 mg/dL (ref 0.44–1.00)
GFR calc Af Amer: >60 mL/min (ref >60)
GFR, EST NON AFRICAN AMERICAN: 60 mL/min — AB (ref >60)
Glucose, Bld: 85 mg/dL (ref 65–99)
POTASSIUM: 4.9 mmol/L (ref 3.5–5.1)
Sodium: 139 mmol/L (ref 135–145)
Total Bilirubin: 0.5 mg/dL (ref 0.3–1.2)
Total Protein: 7 g/dL (ref 6.5–8.1)

## 2016-05-05 LAB — CBC WITH DIFFERENTIAL/PLATELET
BASOS ABS: 0 10*3/uL (ref 0.0–0.1)
BASOS PCT: 1 %
EOS PCT: 3 %
Eosinophils Absolute: 0.2 10*3/uL (ref 0.0–0.7)
HCT: 45.9 % (ref 36.0–46.0)
Hemoglobin: 15.2 g/dL — ABNORMAL HIGH (ref 12.0–15.0)
LYMPHS PCT: 24 %
Lymphs Abs: 1.4 10*3/uL (ref 0.7–4.0)
MCH: 29.6 pg (ref 26.0–34.0)
MCHC: 33.1 g/dL (ref 30.0–36.0)
MCV: 89.5 fL (ref 78.0–100.0)
MONO ABS: 0.5 10*3/uL (ref 0.1–1.0)
Monocytes Relative: 9 %
Neutro Abs: 3.8 10*3/uL (ref 1.7–7.7)
Neutrophils Relative %: 63 %
PLATELETS: 214 10*3/uL (ref 150–400)
RBC: 5.13 MIL/uL — ABNORMAL HIGH (ref 3.87–5.11)
RDW: 13.5 % (ref 11.5–15.5)
WBC: 6 10*3/uL (ref 4.0–10.5)

## 2016-05-05 LAB — URINALYSIS, ROUTINE W REFLEX MICROSCOPIC
Bilirubin Urine: NEGATIVE
Glucose, UA: NEGATIVE mg/dL
Ketones, ur: NEGATIVE mg/dL
NITRITE: NEGATIVE
Protein, ur: NEGATIVE mg/dL
SPECIFIC GRAVITY, URINE: 1.01 (ref 1.005–1.030)
pH: 7.5 (ref 5.0–8.0)

## 2016-05-05 LAB — URINE MICROSCOPIC-ADD ON

## 2016-05-05 LAB — ABO/RH: ABO/RH(D): O POS

## 2016-05-05 NOTE — Progress Notes (Addendum)
12-09-14 - EKG - EPIC  05-02-16 - LOV - Dr. Fermin Schwab (gyn-onc) EPIC

## 2016-05-05 NOTE — Progress Notes (Signed)
UA/Microscopic lab results from pre-op visit on 05-05-16 faxed to Dr. Everitt Amber via [facility]

## 2016-05-05 NOTE — Anesthesia Preprocedure Evaluation (Addendum)
Anesthesia Evaluation  Patient identified by MRN, date of birth, ID band Patient awake  General Assessment Comment:Past Medical History Diagnosis Date . Parasomnia    Night terrors . Polio    At age 82 or 5 after a tonsillectomy . Muscle fatigue    Post polio . Anxiety    H/O feeling groggy for two weeks after cataract surgery.  . Cataract    Both eyes       Reviewed: Allergy & Precautions, NPO status , Patient's Chart, lab work & pertinent test results  History of Anesthesia Complications (+) history of anesthetic complications  Airway Mallampati: II  TM Distance: >3 FB Neck ROM: Full    Dental no notable dental hx.    Pulmonary neg pulmonary ROS,    Pulmonary exam normal breath sounds clear to auscultation       Cardiovascular + angina Normal cardiovascular exam Rhythm:Regular Rate:Normal     Neuro/Psych Anxiety negative neurological ROS     GI/Hepatic negative GI ROS, Neg liver ROS,   Endo/Other  negative endocrine ROS  Renal/GU negative Renal ROS  negative genitourinary   Musculoskeletal  (+) Arthritis ,   Abdominal   Peds negative pediatric ROS (+)  Hematology negative hematology ROS (+)   Anesthesia Other Findings   Reproductive/Obstetrics negative OB ROS                             Anesthesia Physical Anesthesia Plan  ASA: II  Anesthesia Plan: General   Post-op Pain Management:    Induction: Intravenous  Airway Management Planned: Oral ETT  Additional Equipment:   Intra-op Plan:   Post-operative Plan: Extubation in OR  Informed Consent: I have reviewed the patients History and Physical, chart, labs and discussed the procedure including the risks, benefits and alternatives for the proposed anesthesia with the patient or authorized representative who has indicated his/her understanding and acceptance.   Dental advisory  given  Plan Discussed with: CRNA  Anesthesia Plan Comments: (Pt is 82yo female for robotic hysterectomy with Dr. Denman George on 05/06/16. Pt with hx of polio as a child and resulted lower extremity weakness of the left leg since but no other symptoms. She had a stress test 5yrs ago that was normal per pt and has not seen a cardiologist since that time, and she was diagnosed with costochondritis. She reports she is able to do all of her activities of daily living but does note some slight SOB with walking up hills. Denies CP with exertion. She states that she had extreme fatigue for two weeks following anesthesia for a cataract surgery and is concerned that she will feel the same way following her surgery tomorrow. Discussed with her that her surgery will be done under general anesthesia and that does come with some risks in terms of fatigue, sedation, and in 82yo patients cognitive dysfunction as well. Informed her that we could try to minimize narcotics since she seems to be very sensitive to these drugs as well as avoid all benzodiazepines as they can adversely affect cognition especially in the elderly population. She is accepting of the risks of anesthesia and of the plan to minimize mild altering drugs as best we can. Consider multimodal analgesia with precedex, and tylenol for surgery.)       Anesthesia Quick Evaluation

## 2016-05-06 ENCOUNTER — Encounter (HOSPITAL_COMMUNITY): Payer: Self-pay | Admitting: *Deleted

## 2016-05-06 ENCOUNTER — Ambulatory Visit (HOSPITAL_COMMUNITY): Payer: PPO | Admitting: Anesthesiology

## 2016-05-06 ENCOUNTER — Encounter (HOSPITAL_COMMUNITY)
Admission: RE | Disposition: A | Discharge status: Home / Self Care | Payer: Self-pay | Source: Ambulatory Visit | Attending: Gynecologic Oncology

## 2016-05-06 ENCOUNTER — Ambulatory Visit (HOSPITAL_COMMUNITY)
Admission: RE | Admit: 2016-05-06 | Discharge: 2016-05-07 | Disposition: A | Discharge status: Home / Self Care | Payer: PPO | Source: Ambulatory Visit | Attending: Gynecologic Oncology | Admitting: Gynecologic Oncology

## 2016-05-06 DIAGNOSIS — C775 Secondary and unspecified malignant neoplasm of intrapelvic lymph nodes: Secondary | ICD-10-CM | POA: Diagnosis not present

## 2016-05-06 DIAGNOSIS — M1990 Unspecified osteoarthritis, unspecified site: Secondary | ICD-10-CM | POA: Insufficient documentation

## 2016-05-06 DIAGNOSIS — Z79899 Other long term (current) drug therapy: Secondary | ICD-10-CM | POA: Insufficient documentation

## 2016-05-06 DIAGNOSIS — N838 Other noninflammatory disorders of ovary, fallopian tube and broad ligament: Secondary | ICD-10-CM | POA: Diagnosis not present

## 2016-05-06 DIAGNOSIS — C541 Malignant neoplasm of endometrium: Secondary | ICD-10-CM

## 2016-05-06 DIAGNOSIS — Z8612 Personal history of poliomyelitis: Secondary | ICD-10-CM | POA: Insufficient documentation

## 2016-05-06 DIAGNOSIS — D259 Leiomyoma of uterus, unspecified: Secondary | ICD-10-CM | POA: Diagnosis present

## 2016-05-06 DIAGNOSIS — C542 Malignant neoplasm of myometrium: Secondary | ICD-10-CM | POA: Diagnosis not present

## 2016-05-06 DIAGNOSIS — D251 Intramural leiomyoma of uterus: Secondary | ICD-10-CM | POA: Insufficient documentation

## 2016-05-06 HISTORY — PX: ROBOTIC ASSISTED TOTAL HYSTERECTOMY WITH BILATERAL SALPINGO OOPHERECTOMY: SHX6086

## 2016-05-06 HISTORY — PX: LYMPH NODE BIOPSY: SHX201

## 2016-05-06 LAB — TYPE AND SCREEN
ABO/RH(D): O POS
ANTIBODY SCREEN: NEGATIVE

## 2016-05-06 SURGERY — HYSTERECTOMY, TOTAL, ROBOT-ASSISTED, LAPAROSCOPIC, WITH BILATERAL SALPINGO-OOPHORECTOMY
Anesthesia: General | Site: Abdomen

## 2016-05-06 MED ORDER — HYDROMORPHONE HCL 1 MG/ML IJ SOLN
0.2500 mg | INTRAMUSCULAR | Status: DC | PRN
  Administered 2016-05-06 (×2): 0.5 mg via INTRAVENOUS

## 2016-05-06 MED ORDER — CEFAZOLIN SODIUM-DEXTROSE 2-4 GM/100ML-% IV SOLN
INTRAVENOUS | Status: AC
  Filled 2016-05-06: qty 100

## 2016-05-06 MED ORDER — LIDOCAINE HCL (CARDIAC) 20 MG/ML IV SOLN
INTRAVENOUS | Status: DC | PRN
  Administered 2016-05-06: 40 mg via INTRAVENOUS

## 2016-05-06 MED ORDER — EPHEDRINE SULFATE 50 MG/ML IJ SOLN
INTRAMUSCULAR | Status: AC
  Filled 2016-05-06: qty 1

## 2016-05-06 MED ORDER — ROCURONIUM BROMIDE 100 MG/10ML IV SOLN
INTRAVENOUS | Status: DC | PRN
  Administered 2016-05-06: 15 mg via INTRAVENOUS
  Administered 2016-05-06: 25 mg via INTRAVENOUS

## 2016-05-06 MED ORDER — FENTANYL CITRATE (PF) 100 MCG/2ML IJ SOLN
INTRAMUSCULAR | Status: AC
  Filled 2016-05-06: qty 2

## 2016-05-06 MED ORDER — FENTANYL CITRATE (PF) 100 MCG/2ML IJ SOLN
INTRAMUSCULAR | Status: DC | PRN
  Administered 2016-05-06 (×2): 50 ug via INTRAVENOUS
  Administered 2016-05-06: 25 ug via INTRAVENOUS
  Administered 2016-05-06: 50 ug via INTRAVENOUS
  Administered 2016-05-06: 25 ug via INTRAVENOUS

## 2016-05-06 MED ORDER — ONDANSETRON HCL 4 MG PO TABS: 4.0000 mg | ORAL_TABLET | Freq: Four times a day (QID) | ORAL | Status: DC | PRN

## 2016-05-06 MED ORDER — STERILE WATER FOR IRRIGATION IR SOLN
Status: DC | PRN
  Administered 2016-05-06: 1000 mL

## 2016-05-06 MED ORDER — ONDANSETRON HCL 4 MG/2ML IJ SOLN: 4.0000 mg | Freq: Four times a day (QID) | INTRAMUSCULAR | Status: DC | PRN

## 2016-05-06 MED ORDER — LEVOTHYROXINE SODIUM 100 MCG PO TABS
100.0000 ug | ORAL_TABLET | Freq: Every day | ORAL | Status: DC
  Administered 2016-05-07: 100 ug via ORAL
  Filled 2016-05-06 (×3): qty 1

## 2016-05-06 MED ORDER — KCL IN DEXTROSE-NACL 20-5-0.45 MEQ/L-%-% IV SOLN
INTRAVENOUS | Status: DC
  Administered 2016-05-06: 18:00:00 via INTRAVENOUS
  Filled 2016-05-06 (×2): qty 1000

## 2016-05-06 MED ORDER — GABAPENTIN 300 MG PO CAPS
300.0000 mg | ORAL_CAPSULE | Freq: Every day | ORAL | Status: DC
  Filled 2016-05-06 (×2): qty 1

## 2016-05-06 MED ORDER — TRAMADOL HCL 50 MG PO TABS
100.0000 mg | ORAL_TABLET | Freq: Two times a day (BID) | ORAL | Status: DC | PRN
  Filled 2016-05-06: qty 2

## 2016-05-06 MED ORDER — ONDANSETRON HCL 4 MG/2ML IJ SOLN
INTRAMUSCULAR | Status: AC
  Filled 2016-05-06: qty 2

## 2016-05-06 MED ORDER — LIDOCAINE HCL (CARDIAC) 20 MG/ML IV SOLN
INTRAVENOUS | Status: AC
  Filled 2016-05-06: qty 5

## 2016-05-06 MED ORDER — PROPOFOL 10 MG/ML IV BOLUS
INTRAVENOUS | Status: DC | PRN
  Administered 2016-05-06: 100 mg via INTRAVENOUS
  Administered 2016-05-06: 40 mg via INTRAVENOUS

## 2016-05-06 MED ORDER — SUGAMMADEX SODIUM 200 MG/2ML IV SOLN
INTRAVENOUS | Status: AC
  Filled 2016-05-06: qty 2

## 2016-05-06 MED ORDER — LACTATED RINGERS IR SOLN
Status: DC | PRN
  Administered 2016-05-06: 1000 mL

## 2016-05-06 MED ORDER — GABAPENTIN 600 MG PO TABS
300.0000 mg | ORAL_TABLET | Freq: Every day | ORAL | Status: DC
  Filled 2016-05-06: qty 0.5

## 2016-05-06 MED ORDER — CEFAZOLIN SODIUM-DEXTROSE 2-4 GM/100ML-% IV SOLN
2.0000 g | INTRAVENOUS | Status: AC
  Administered 2016-05-06: 2 g via INTRAVENOUS
  Filled 2016-05-06: qty 100

## 2016-05-06 MED ORDER — ACETAMINOPHEN 500 MG PO TABS
1000.0000 mg | ORAL_TABLET | Freq: Two times a day (BID) | ORAL | Status: DC
  Administered 2016-05-06 – 2016-05-07 (×2): 1000 mg via ORAL
  Filled 2016-05-06 (×4): qty 2

## 2016-05-06 MED ORDER — ACETAMINOPHEN 10 MG/ML IV SOLN
INTRAVENOUS | Status: AC
  Filled 2016-05-06: qty 100

## 2016-05-06 MED ORDER — HYDROMORPHONE HCL 1 MG/ML IJ SOLN
0.2000 mg | INTRAMUSCULAR | Status: AC | PRN
  Administered 2016-05-06 – 2016-05-07 (×2): 0.5 mg via INTRAVENOUS
  Filled 2016-05-06 (×2): qty 1

## 2016-05-06 MED ORDER — LACTATED RINGERS IV SOLN
INTRAVENOUS | Status: DC
  Administered 2016-05-06 (×3): via INTRAVENOUS

## 2016-05-06 MED ORDER — ACETAMINOPHEN 10 MG/ML IV SOLN
INTRAVENOUS | Status: DC | PRN
  Administered 2016-05-06: 1000 mg via INTRAVENOUS

## 2016-05-06 MED ORDER — HYDROMORPHONE HCL 1 MG/ML IJ SOLN
INTRAMUSCULAR | Status: AC
  Filled 2016-05-06: qty 1

## 2016-05-06 MED ORDER — ENOXAPARIN SODIUM 40 MG/0.4ML ~~LOC~~ SOLN
40.0000 mg | SUBCUTANEOUS | Status: DC
  Filled 2016-05-06 (×3): qty 0.4

## 2016-05-06 MED ORDER — LABETALOL HCL 5 MG/ML IV SOLN
INTRAVENOUS | Status: DC | PRN
  Administered 2016-05-06: 2.5 mg via INTRAVENOUS

## 2016-05-06 MED ORDER — PROPOFOL 10 MG/ML IV BOLUS
INTRAVENOUS | Status: AC
  Filled 2016-05-06: qty 20

## 2016-05-06 MED ORDER — DEXAMETHASONE SODIUM PHOSPHATE 10 MG/ML IJ SOLN
INTRAMUSCULAR | Status: AC
  Filled 2016-05-06: qty 1

## 2016-05-06 MED ORDER — LABETALOL HCL 5 MG/ML IV SOLN
INTRAVENOUS | Status: AC
Start: 2016-05-06 — End: 2016-05-06
  Filled 2016-05-06: qty 4

## 2016-05-06 MED ORDER — SUGAMMADEX SODIUM 200 MG/2ML IV SOLN
INTRAVENOUS | Status: DC | PRN
  Administered 2016-05-06: 200 mg via INTRAVENOUS

## 2016-05-06 MED ORDER — ONDANSETRON HCL 4 MG/2ML IJ SOLN
INTRAMUSCULAR | Status: DC | PRN
  Administered 2016-05-06: 4 mg via INTRAVENOUS

## 2016-05-06 MED ORDER — CLONAZEPAM 0.5 MG PO TABS
0.5000 mg | ORAL_TABLET | Freq: Every day | ORAL | Status: DC
  Administered 2016-05-06: 0.5 mg via ORAL
  Filled 2016-05-06: qty 1

## 2016-05-06 MED ORDER — OXYCODONE HCL 5 MG PO TABS
5.0000 mg | ORAL_TABLET | ORAL | Status: DC | PRN
  Filled 2016-05-06: qty 1

## 2016-05-06 MED ORDER — ROCURONIUM BROMIDE 100 MG/10ML IV SOLN
INTRAVENOUS | Status: AC
  Filled 2016-05-06: qty 1

## 2016-05-06 MED ORDER — ENOXAPARIN SODIUM 40 MG/0.4ML ~~LOC~~ SOLN
40.0000 mg | SUBCUTANEOUS | Status: AC
  Administered 2016-05-06: 40 mg via SUBCUTANEOUS
  Filled 2016-05-06: qty 0.4

## 2016-05-06 MED ORDER — DEXAMETHASONE SODIUM PHOSPHATE 10 MG/ML IJ SOLN
INTRAMUSCULAR | Status: DC | PRN
  Administered 2016-05-06: 10 mg via INTRAVENOUS

## 2016-05-06 MED ORDER — SODIUM CHLORIDE 0.9 % IJ SOLN
INTRAMUSCULAR | Status: AC
  Filled 2016-05-06: qty 10

## 2016-05-06 SURGICAL SUPPLY — 55 items
APPLICATOR SURGIFLO ENDO (HEMOSTASIS) ×3 IMPLANT
CHLORAPREP W/TINT 26ML (MISCELLANEOUS) ×3 IMPLANT
COVER SURGICAL LIGHT HANDLE (MISCELLANEOUS) ×3 IMPLANT
COVER TIP SHEARS 8 DVNC (MISCELLANEOUS) ×2 IMPLANT
COVER TIP SHEARS 8MM DA VINCI (MISCELLANEOUS) ×1
DRAPE ARM DVNC X/XI (DISPOSABLE) ×8 IMPLANT
DRAPE COLUMN DVNC XI (DISPOSABLE) ×2 IMPLANT
DRAPE DA VINCI XI ARM (DISPOSABLE) ×4
DRAPE DA VINCI XI COLUMN (DISPOSABLE) ×1
DRAPE SHEET LG 3/4 BI-LAMINATE (DRAPES) ×6 IMPLANT
DRAPE SURG IRRIG POUCH 19X23 (DRAPES) ×3 IMPLANT
ELECT REM PT RETURN 15FT ADLT (MISCELLANEOUS) ×3 IMPLANT
GLOVE BIO SURGEON STRL SZ 6 (GLOVE) ×12 IMPLANT
GLOVE BIO SURGEON STRL SZ 6.5 (GLOVE) ×6 IMPLANT
GOWN STRL REUS W/ TWL LRG LVL3 (GOWN DISPOSABLE) ×4 IMPLANT
GOWN STRL REUS W/TWL LRG LVL3 (GOWN DISPOSABLE) ×2
HOLDER FOLEY CATH W/STRAP (MISCELLANEOUS) ×3 IMPLANT
KIT BASIN OR (CUSTOM PROCEDURE TRAY) ×3 IMPLANT
KIT PROCEDURE DA VINCI SI (MISCELLANEOUS)
KIT PROCEDURE DVNC SI (MISCELLANEOUS) IMPLANT
LIQUID BAND (GAUZE/BANDAGES/DRESSINGS) ×3 IMPLANT
MANIPULATOR UTERINE 4.5 ZUMI (MISCELLANEOUS) ×3 IMPLANT
MARKER SKIN DUAL TIP RULER LAB (MISCELLANEOUS) ×3 IMPLANT
NDL SAFETY ECLIPSE 18X1.5 (NEEDLE) ×2 IMPLANT
NEEDLE HYPO 18GX1.5 SHARP (NEEDLE) ×1
NEEDLE SPNL 18GX3.5 QUINCKE PK (NEEDLE) ×3 IMPLANT
OBTURATOR XI 8MM BLADELESS (TROCAR) ×3 IMPLANT
OCCLUDER COLPOPNEUMO (BALLOONS) ×3 IMPLANT
PAD POSITIONER PINK NONSTERILE (MISCELLANEOUS) ×3 IMPLANT
PAD POSITIONING PINK XL (MISCELLANEOUS) ×3 IMPLANT
PORT ACCESS TROCAR AIRSEAL 12 (TROCAR) ×2 IMPLANT
PORT ACCESS TROCAR AIRSEAL 5M (TROCAR) ×1
POUCH ENDO CATCH II 15MM (MISCELLANEOUS) IMPLANT
POUCH SPECIMEN RETRIEVAL 10MM (ENDOMECHANICALS) ×3 IMPLANT
SEAL CANN UNIV 5-8 DVNC XI (MISCELLANEOUS) ×8 IMPLANT
SEAL XI 5MM-8MM UNIVERSAL (MISCELLANEOUS) ×4
SET TRI-LUMEN FLTR TB AIRSEAL (TUBING) ×3 IMPLANT
SET TUBE IRRIG SUCTION NO TIP (IRRIGATION / IRRIGATOR) ×3 IMPLANT
SHEET LAVH (DRAPES) ×3 IMPLANT
SOLUTION ELECTROLUBE (MISCELLANEOUS) ×3 IMPLANT
SURGIFLO W/THROMBIN 8M KIT (HEMOSTASIS) ×3 IMPLANT
SUT MNCRL AB 4-0 PS2 18 (SUTURE) ×6 IMPLANT
SUT VIC AB 0 CT1 27 (SUTURE) ×1
SUT VIC AB 0 CT1 27XBRD ANTBC (SUTURE) ×2 IMPLANT
SYR 50ML LL SCALE MARK (SYRINGE) ×3 IMPLANT
SYRINGE 10CC LL (SYRINGE) ×3 IMPLANT
TOWEL OR 17X26 10 PK STRL BLUE (TOWEL DISPOSABLE) ×6 IMPLANT
TOWEL OR NON WOVEN STRL DISP B (DISPOSABLE) ×3 IMPLANT
TRAP SPECIMEN MUCOUS 40CC (MISCELLANEOUS) IMPLANT
TRAY FOLEY W/METER SILVER 14FR (SET/KITS/TRAYS/PACK) ×3 IMPLANT
TRAY LAPAROSCOPIC (CUSTOM PROCEDURE TRAY) ×3 IMPLANT
TROCAR BLADELESS OPT 5 100 (ENDOMECHANICALS) ×3 IMPLANT
UNDERPAD 30X30 (UNDERPADS AND DIAPERS) IMPLANT
UNDERPAD 30X30 INCONTINENT (UNDERPADS AND DIAPERS) ×3 IMPLANT
WATER STERILE IRR 1500ML POUR (IV SOLUTION) IMPLANT

## 2016-05-06 NOTE — H&P (View-Only) (Signed)
Consult Note: Gyn-Onc   Teresa Norris 82 y.o. female  Chief Complaint  Patient presents with  . New Patient (Initial Visit)    Endometrial cancer    Assessment : Endometrial adenocarcinoma. Plan: The natural history of in utero cancer was discussed the patient and her family. I recommend she undergo robotic hysterectomy bilateral salpingo-oophorectomy and intraoperative frozen section to determine whether she has risk factors that would warrant lymphadenectomy.  The patient and family understand that Dr. Everitt Amber will be the primary surgeon. Surgery is scheduled for Tuesday, 05/06/2016.  The risks of surgery were reviewed including hemorrhage, infection, injury to adjacent viscera, anesthetic risks, and venous thromboembolic complications. All questions are answered.   HPI: 82 year old white female seen in consultation request of Dr. Simona Huh regarding management of a newly diagnosed endometrial carcinoma. Proctoscopy 6 weeks ago the patient had her first onset of abnormal bleeding and 40 years. This abated but approximately a month later she began bleeding again. She was then seen by Dr.Varnado who obtained an ultrasound showing a normal uterus and ovaries. Endometrial biopsy was interpreted as adenocarcinoma. The patient continued to have some light bleeding. She denies any pelvic pain.  The patient has no past gynecologic history.  She has a past history of polio as a child and she claims that she has prolonged slow recovery following anesthesia.  Review of Systems:10 point review of systems is negative except as noted in interval history.   Vitals: Blood pressure 149/86, pulse 80, temperature 97.9 F (36.6 C), temperature source Oral, resp. rate 20, height 5\' 5"  (1.651 m), weight 169 lb 4.8 oz (76.794 kg), SpO2 98 %.  Physical Exam: General : The patient is a healthy woman in no acute distress.  HEENT: normocephalic, extraoccular movements normal; neck is supple without  thyromegally  Lynphnodes: Supraclavicular and inguinal nodes not enlarged  Abdomen: Soft, non-tender, no ascites, no organomegally, no masses, no hernias  Pelvic:  EGBUS: Normal female  Vagina: Normal, no lesions , there is some blood in the vagina. Urethra and Bladder: Normal, non-tender  Cervix: Normal, no lesions are noted.  Uterus: Anterior normal shape size and consistency. Bi-manual examination: Non-tender; no adenxal masses or nodularity  Rectal: normal sphincter tone, no masses, no blood  Lower extremities: No edema or varicosities. Normal range of motion      Allergies  Allergen Reactions  . Aspirin Anaphylaxis    STOMACH PAIN/BLEEDING ULCERS    Past Medical History  Diagnosis Date  . Parasomnia     Night terrors  . Polio     At age 61 or 5 after a tonsillectomy  . Muscle fatigue     Post polio  . Anxiety   . Cataract     Both eyes    Past Surgical History  Procedure Laterality Date  . Eye surgery Bilateral Lt 11/12   Rt 12/12    CATARACT  . Leg surgery Left 1988  . Foot surgery  1938  . Breast surgery      BENIGN NODES  . 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 acetaminophen (TYLENOL) 500 MG tablet Take 500-1,000 mg by mouth every 6 (six) hours as needed for mild pain.    . clonazePAM (KLONOPIN) 0.5 MG tablet TAKE 1 TABLET BY MOUTH AT NIGHT 90 tablet 0  . levothyroxine (SYNTHROID, LEVOTHROID) 100 MCG tablet Take 100 mcg by mouth daily before breakfast.  No current facility-administered medications for this visit.    Social History   Social History  . Marital Status: Married    Spouse Name: [name]  . Number of Children: 3  . Years of Education: 14   Occupational History  . Not on file.   Social History Main Topics  . Smoking status: Never Smoker   . Smokeless tobacco: Never Used  . Alcohol Use: 2.0 - 2.5 oz/week    4-5 drink(s) per week     Comment: WINE    . Drug Use: No  . Sexual Activity: Not on file   Other Topics Concern  . Not on file   Social History Narrative   Patient is married [name]) and lives at home with her husband.   Patient has three children.   Patient is retired.   Patient has a college education.   Patient drinks caffeine once in a while.    Family History  Problem Relation Age of Onset  . Cancer Brother   . Parkinson's disease Father   . Acute myelogenous leukemia        Teresa Norris,Teresa Imhoff L, MD 05/02/2016, 8:44 AM

## 2016-05-06 NOTE — Transfer of Care (Signed)
Immediate Anesthesia Transfer of Care Note  Patient: Teresa Norris  Procedure(s) Performed: Procedure(s): XI ROBOTIC ASSISTED TOTAL HYSTERECTOMY WITH BILATERAL SALPINGO OOPHORECTOMY (N/A) Sentinel LYMPH NODE BIOPSY right total lymphandenapathy (N/A)  Patient Location: PACU  Anesthesia Type:General  Level of Consciousness: awake, alert  and oriented  Airway & Oxygen Therapy: Patient Spontanous Breathing and Patient connected to face mask oxygen  Post-op Assessment: Report given to RN and Post -op Vital signs reviewed and stable  Post vital signs: Reviewed and stable  Last Vitals:  Filed Vitals:   05/06/16 1140  BP: 147/82  Pulse: 73  Temp: 36.3 C  Resp: 20    Last Pain:  Filed Vitals:   05/06/16 1156  PainSc: 1       Patients Stated Pain Goal: 4 ([id number])  Complications: No apparent anesthesia complications

## 2016-05-06 NOTE — Op Note (Signed)
OPERATIVE NOTE 05/06/16  Surgeon: Donaciano Eva   Assistants: Dr Lahoma Crocker (an MD assistant was necessary for tissue manipulation, management of robotic instrumentation, retraction and positioning due to the complexity of the case and hospital policies).   Anesthesia: General endotracheal anesthesia  ASA Class: 3   Pre-operative Diagnosis: endometrial cancer  Post-operative Diagnosis: same  Operation: Robotic-assisted laparoscopic total hysterectomy with bilateral salpingoophorectomy, sentinel lymph node biopsy, right pelvic lymphadenectomy   Surgeon: Donaciano Eva  Assistant Surgeon: Lahoma Crocker MD  Anesthesia: GET  Urine Output: 100  Operative Findings:  : 12cm fibroid uterus, intramural fibroids, no suspicious extrauterine disease, failed mapping on right side  Estimated Blood Loss:  less than 50 mL      Total IV Fluids: 800 ml         Specimens: left external iliac SLN, uterus, cervix, bilateral tubes and ovaries, right pelvic lymph nodes         Complications:  None; patient tolerated the procedure well.         Disposition: PACU - hemodynamically stable.  Procedure Details  The patient was seen in the Holding Room. The risks, benefits, complications, treatment options, and expected outcomes were discussed with the patient.  The patient concurred with the proposed plan, giving informed consent.  The site of surgery properly noted/marked. The patient was identified as Teresa Norris and the procedure verified as a Robotic-assisted hysterectomy with bilateral salpingo oophorectomy and SLN biopsy. A Time Out was held and the above information confirmed.  After induction of anesthesia, the patient was draped and prepped in the usual sterile manner. Pt was placed in supine position after anesthesia and draped and prepped in the usual sterile manner. The abdominal drape was placed after the CholoraPrep had been allowed to dry for 3 minutes.  Her arms  were tucked to her side with all appropriate precautions.  The shoulders were stabilized with padded shoulder blocks applied to the acromium processes.  The patient was placed in the semi-lithotomy position in [place].  The perineum was prepped with Betadine. The patient was then prepped. Foley catheter was placed.  A sterile speculum was placed in the vagina.  The cervix was grasped with a single-tooth tenaculum and dilated with Kennon Rounds dilators. 1mg  total of ICG was injected into the cervical stroma at 2 and 9 o'clock at a 54mm depth (concentration 0..5mg /ml).  The ZUMI uterine manipulator with a medium colpotomizer ring was placed without difficulty.  A pneum occluder balloon was placed over the manipulator.  OG tube placement was confirmed and to suction.   Next, a 5 mm skin incision was made 1 cm below the subcostal margin in the midclavicular line.  The 5 mm Optiview port and scope was used for direct entry.  Opening pressure was under 10 mm CO2.  The abdomen was insufflated and the findings were noted as above.   At this point and all points during the procedure, the patient's intra-abdominal pressure did not exceed 15 mmHg. Next, a 10 mm skin incision was made in the umbilicus and a right and left port was placed about 10 cm lateral to the robot port on the right and left side.  A fourth arm was placed in the left lower quadrant 2 cm above and superior and medial to the anterior superior iliac spine.  All ports were placed under direct visualization.  The patient was placed in steep Trendelenburg.  Bowel was folded away into the upper abdomen.  The robot  was docked in the normal manner.  The right and left peritoneum were opened parallel to the IP ligament to open the retroperitoneal spaces bilaterally. The SLN mapping was performed in bilateral pelvic basins. The para rectal and paravesical spaces were opened up. Lymphatic channels were identified travelling to the following visualized sentinel  lymph node's: left external iliac SLN. There was no mapping on the patient's right side and therefore lymphadenectomy on the right was performed. These SLN's were separated from their surrounding lymphatic tissue, removed and sent for permanent pathology.  The right paravesical space was developed with monopolar and sharp dissection. It was held open with tension on the median umbilical ligament with the forth arm. The paravesical space was opened with blunt and sharp dissection to mobilize the ureter off of the medial surface of the internal iliac artery. The medial leaf of the broad ligament containing the ureter was held medially (opening the pararectal space) by the assistant's grasper. The right pelvic lymphadenectomy was performed by skeletonizing the internal iliac artery at the bifurcation with the external iliac artery. The obturator nerve was identified in the base of lateral paravesical space. The ureter was mobilized medially off of the dissection by developing the pararectal space. The genitofemoral nerve was identified, skeletonized and mobilized laterally off of the external iliac artery. An enbloc resection of lymph nodes was performed within the following boundaries: the mid portion of the common iliac proximally, the circumflex iliac vein distally, the obturator nerve posteriorally, the genitofemoral nerve laterally. The nodal basin (including obturator space) were confirmed to be empty of nodes and hemostatic. The nodes were placed in an endocatch bag and retrieved vaginally.  The hysterectomy was started after the round ligament on the right side was incised and the retroperitoneum was entered and the pararectal space was developed.  The ureter was noted to be on the medial leaf of the broad ligament.  The peritoneum above the ureter was incised and stretched and the infundibulopelvic ligament was skeletonized, cauterized and cut.  The posterior peritoneum was taken down to the level of the  KOH ring.  The anterior peritoneum was also taken down.  The bladder flap was created to the level of the KOH ring.  The uterine artery on the right side was skeletonized, cauterized and cut in the normal manner.  A similar procedure was performed on the left.  The colpotomy was made and the uterus, cervix, bilateral ovaries and tubes were amputated and attempted to be delivered through the vagina.  The cervix was amputated/avulsed during specimen delivery and therefore the uterus was placed in a bag and delivered vaginally. Pedicles were inspected and excellent hemostasis was achieved.    The colpotomy at the vaginal cuff was closed with Vicryl on a CT1 needle in a running manner.  Copious irrigation was used and excellent hemostasis was achieved.  At this point in the procedure was completed.  Robotic instruments were removed under direct visulaization.  The robot was undocked. The 10 mm ports were closed with Vicryl on a UR-5 needle and the fascia was closed with 0 Vicryl on a UR-5 needle.  The skin was closed with 4-0 Vicryl in a subcuticular manner. A vaginal abrasion induced during specimen delivery was closed with 3-0 vicryl. Dermabond was applied.  Sponge, lap and needle counts correct x 2.  The patient was taken to the recovery room in stable condition.  The vagina was swabbed with  minimal bleeding noted.   All instrument and needle counts were  correct x  3.   The patient was transferred to the recovery room in a stable condition.  Donaciano Eva, MD

## 2016-05-06 NOTE — Interval H&P Note (Signed)
History and Physical Interval Note:  05/06/2016 12:39 PM  Teresa Norris  has presented today for surgery, with the diagnosis of endometrial cancer  The various methods of treatment have been discussed with the patient and family. After consideration of risks, benefits and other options for treatment, the patient has consented to  Procedure(s): XI ROBOTIC ASSISTED TOTAL HYSTERECTOMY WITH BILATERAL SALPINGO OOPHORECTOMY (N/A) Sentinel LYMPH NODE BIOPSY (N/A) as a surgical intervention .  The patient's history has been reviewed, patient examined, no change in status, stable for surgery.  I have reviewed the patient's chart and labs.  Questions were answered to the patient's satisfaction.     Donaciano Eva

## 2016-05-06 NOTE — Anesthesia Procedure Notes (Signed)
Procedure Name: Intubation Date/Time: 05/06/2016 1:07 PM Performed by: Glory Buff Pre-anesthesia Checklist: Patient identified, Emergency Drugs available, Suction available and Patient being monitored Patient Re-evaluated:Patient Re-evaluated prior to inductionOxygen Delivery Method: Circle system utilized Preoxygenation: Pre-oxygenation with 100% oxygen Intubation Type: IV induction Ventilation: Mask ventilation without difficulty Laryngoscope Size: Mac and 3 Grade View: Grade I Tube type: Oral Tube size: 7.0 mm Number of attempts: 1 Airway Equipment and Method: Stylet and Oral airway Placement Confirmation: ETT inserted through vocal cords under direct vision,  positive ETCO2 and breath sounds checked- equal and bilateral Secured at: 20 cm Tube secured with: Tape Dental Injury: Teeth and Oropharynx as per pre-operative assessment

## 2016-05-07 ENCOUNTER — Telehealth: Payer: Self-pay

## 2016-05-07 ENCOUNTER — Ambulatory Visit: Payer: PPO | Admitting: Gynecologic Oncology

## 2016-05-07 ENCOUNTER — Encounter (HOSPITAL_COMMUNITY): Payer: Self-pay | Admitting: Gynecologic Oncology

## 2016-05-07 DIAGNOSIS — C541 Malignant neoplasm of endometrium: Secondary | ICD-10-CM | POA: Diagnosis not present

## 2016-05-07 DIAGNOSIS — G8918 Other acute postprocedural pain: Secondary | ICD-10-CM

## 2016-05-07 LAB — CBC
HCT: 42.4 % (ref 36.0–46.0)
Hemoglobin: 14.6 g/dL (ref 12.0–15.0)
MCH: 30.1 pg (ref 26.0–34.0)
MCHC: 34.4 g/dL (ref 30.0–36.0)
MCV: 87.4 fL (ref 78.0–100.0)
PLATELETS: 186 10*3/uL (ref 150–400)
RBC: 4.85 MIL/uL (ref 3.87–5.11)
RDW: 13.2 % (ref 11.5–15.5)
WBC: 8.6 10*3/uL (ref 4.0–10.5)

## 2016-05-07 LAB — BASIC METABOLIC PANEL
Anion gap: 7 (ref 5–15)
BUN: 19 mg/dL (ref 6–20)
CO2: 26 mmol/L (ref 22–32)
Calcium: 8.4 mg/dL — ABNORMAL LOW (ref 8.9–10.3)
Chloride: 101 mmol/L (ref 101–111)
Creatinine, Ser: 0.81 mg/dL (ref 0.44–1.00)
GFR calc Af Amer: >60 mL/min (ref >60)
GLUCOSE: 146 mg/dL — AB (ref 65–99)
POTASSIUM: 4.2 mmol/L (ref 3.5–5.1)
Sodium: 134 mmol/L — ABNORMAL LOW (ref 135–145)

## 2016-05-07 MED ORDER — ACETAMINOPHEN 500 MG PO TABS
1000.0000 mg | ORAL_TABLET | Freq: Two times a day (BID) | ORAL | Status: DC
Start: 2016-05-07 — End: 2016-05-30

## 2016-05-07 MED ORDER — TRAMADOL HCL 50 MG PO TABS: 50.0000 mg | ORAL_TABLET | Freq: Four times a day (QID) | ORAL | Status: DC | PRN

## 2016-05-07 MED ORDER — DOCUSATE SODIUM 100 MG PO CAPS
100.0000 mg | ORAL_CAPSULE | Freq: Two times a day (BID) | ORAL | Status: DC
Start: 2016-05-07 — End: 2017-05-20

## 2016-05-07 MED ORDER — OXYCODONE HCL 5 MG PO TABS: 5.0000 mg | ORAL_TABLET | ORAL | Status: DC | PRN

## 2016-05-07 NOTE — Telephone Encounter (Signed)
Orders received to contact [facility] to refill Tramadol 50-100 MG every 6 hours PRN pain, QTY: 30 , no refills for moderate pain , patient is aware .

## 2016-05-07 NOTE — Progress Notes (Signed)
Patient alert and oriented with pain controlled and vital signs stable. Patient given discharge instructions and prescriptions. Upon details of prescriptions patient stated that oxycodone was "too much" medication and wanted something milder. Spoke with PA, Cross and prescription for Tramadol was called in to patient's pharmacy per PA, Cross. Patient informed of medication change. All questions and concerns answered. Patient verbalized understanding of instructions.

## 2016-05-07 NOTE — Discharge Instructions (Signed)
05/07/2016  Return to work: 4 weeks  Activity: 1. Be up and out of the bed during the day.  Take a nap if needed.  You may walk up steps but be careful and use the hand rail.  Stair climbing will tire you more than you think, you may need to stop part way and rest.   2. No lifting or straining for 6 weeks.  3. No driving for 1-2 weeks.  Do Not drive if you are taking narcotic pain medicine.  4. Shower daily.  Use soap and water on your incision and pat dry; don't rub.   5. No sexual activity and nothing in the vagina for 6 weeks.  Diet: 1. Low sodium Heart Healthy Diet is recommended.  2. It is safe to use a laxative if you have difficulty moving your bowels.   Wound Care: 1. Keep clean and dry.  Shower daily.  Reasons to call the Doctor:   Fever - Oral temperature greater than 100.4 degrees Fahrenheit  Foul-smelling vaginal discharge  Difficulty urinating  Nausea and vomiting  Increased pain at the site of the incision that is unrelieved with pain medicine.  Difficulty breathing with or without chest pain  New calf pain especially if only on one side  Sudden, continuing increased vaginal bleeding with or without clots.   Follow-up: 1. See Everitt Amber in 3 weeks.  Contacts: For questions or concerns you should contact:  Dr. Everitt Amber at [phone]  or at [place]

## 2016-05-07 NOTE — Discharge Summary (Signed)
Physician Discharge Summary  Patient ID: Teresa Norris MRN: [id number] DOB/AGE: 80-May-1929 80 y.o.  Admit date: 05/06/2016 Discharge date: 05/07/2016  Admission Diagnoses: Endometrial cancer [facility])  Discharge Diagnoses:  Principal Problem:   Endometrial cancer [facility]) Active Problems:   Fibroid uterus   Discharged Condition: good  Hospital Course: patient was admitted on 05/06/16 for robotic assisted total hysterectomy, BSO, and SLN mapping for endometrial cancer. Her surgery was uncomplicated, though she had an enlarged fibroid uterus with a difficult vaginal extraction (contained within a bag). She did well overnight and was meeting discharge criteria on POD 1.  Consults: None  Significant Diagnostic Studies: labs:  CBC    Component Value Date/Time   WBC 8.6 05/07/2016 0440   RBC 4.85 05/07/2016 0440   HGB 14.6 05/07/2016 0440   HCT 42.4 05/07/2016 0440   PLT 186 05/07/2016 0440   MCV 87.4 05/07/2016 0440   MCH 30.1 05/07/2016 0440   MCHC 34.4 05/07/2016 0440   RDW 13.2 05/07/2016 0440   LYMPHSABS 1.4 05/05/2016 1500   MONOABS 0.5 05/05/2016 1500   EOSABS 0.2 05/05/2016 1500   BASOSABS 0.0 05/05/2016 1500      Treatments: surgery: see above  Discharge Exam: Blood pressure 124/59, pulse 77, temperature 98.2 F (36.8 C), temperature source Oral, resp. rate 16, height 5\' 5"  (1.651 m), weight 170 lb (77.111 kg), SpO2 95 %. General appearance: alert and cooperative Resp: clear to auscultation bilaterally Cardio: regular rate and rhythm, S1, S2 normal, no murmur, click, rub or gallop GI: soft, non-tender; bowel sounds normal; no masses,  no organomegaly Incision/Wound: clean, dry and intact  Disposition: 01-Home or Self Care  Discharge Instructions    (HEART FAILURE PATIENTS) Call MD:  Anytime you have any of the following symptoms: 1) 3 pound weight gain in 24 hours or 5 pounds in 1 week 2) shortness of breath, with or without a dry hacking cough 3) swelling in the  hands, feet or stomach 4) if you have to sleep on extra pillows at night in order to breathe.    Complete by:  As directed      Call MD for:  difficulty breathing, headache or visual disturbances    Complete by:  As directed      Call MD for:  extreme fatigue    Complete by:  As directed      Call MD for:  hives    Complete by:  As directed      Call MD for:  persistant dizziness or light-headedness    Complete by:  As directed      Call MD for:  persistant nausea and vomiting    Complete by:  As directed      Call MD for:  redness, tenderness, or signs of infection (pain, swelling, redness, odor or green/yellow discharge around incision site)    Complete by:  As directed      Call MD for:  severe uncontrolled pain    Complete by:  As directed      Call MD for:  temperature >100.4    Complete by:  As directed      Diet - low sodium heart healthy    Complete by:  As directed      Diet general    Complete by:  As directed      Driving Restrictions    Complete by:  As directed   No driving for 7 days or until off narcotic pain medication  Increase activity slowly    Complete by:  As directed      Remove dressing in 24 hours    Complete by:  As directed      Sexual Activity Restrictions    Complete by:  As directed   No intercourse for 6 weeks            Medication List    TAKE these medications        acetaminophen 500 MG tablet  Commonly known as:  TYLENOL  Take 500-1,000 mg by mouth every 6 (six) hours as needed for mild pain.     acetaminophen 500 MG tablet  Commonly known as:  TYLENOL  Take 2 tablets (1,000 mg total) by mouth every 12 (twelve) hours.     clonazePAM 0.5 MG tablet  Commonly known as:  KLONOPIN  TAKE 1 TABLET BY MOUTH AT NIGHT     docusate sodium 100 MG capsule  Commonly known as:  COLACE  Take 1 capsule (100 mg total) by mouth 2 (two) times daily.     levothyroxine 100 MCG tablet  Commonly known as:  SYNTHROID, LEVOTHROID  Take 100 mcg by  mouth daily before breakfast.     oxyCODONE 5 MG immediate release tablet  Commonly known as:  Oxy IR/ROXICODONE  Take 1 tablet (5 mg total) by mouth every 4 (four) hours as needed for severe pain or breakthrough pain.     polyvinyl alcohol 1.4 % ophthalmic solution  Commonly known as:  LIQUIFILM TEARS  Place 2 drops into both eyes daily.           Follow-up Information    Follow up with Donaciano Eva, MD In 3 weeks.   Specialty:  Obstetrics and Gynecology   Contact information:   [place] [id number] [phone]       Signed: Donaciano Eva 05/07/2016, 9:03 AM

## 2016-05-10 NOTE — Anesthesia Postprocedure Evaluation (Signed)
Anesthesia Post Note  Patient: IVIONNA PENAS  Procedure(s) Performed: Procedure(s) (LRB): XI ROBOTIC ASSISTED TOTAL HYSTERECTOMY WITH BILATERAL SALPINGO OOPHORECTOMY (N/A) Sentinel LYMPH NODE BIOPSY right total lymphandenapathy (N/A)  Patient location during evaluation: PACU Anesthesia Type: General Level of consciousness: awake Pain management: pain level controlled Vital Signs Assessment: post-procedure vital signs reviewed and stable Respiratory status: spontaneous breathing Cardiovascular status: stable Anesthetic complications: no    Last Vitals:  Filed Vitals:   05/07/16 0558 05/07/16 0916  BP: 124/59 142/67  Pulse: 77 77  Temp: 36.8 C 36.8 C  Resp: 16 16    Last Pain:  Filed Vitals:   05/07/16 0943  PainSc: 1                  EDWARDS,Dannika Hilgeman

## 2016-05-12 ENCOUNTER — Telehealth: Payer: Self-pay

## 2016-05-12 NOTE — Telephone Encounter (Signed)
Patient called to update Dr Everitt Amber that one of her incisions located at her umbilicus is bleeding "oozing" . Patient states it look like a smear of blood today compared to yesterday it appeared to look like a very small "blot" of blood. Patient denies pain , states it's "sore" ,states she has pain from her arthritis not the incisions and that the oozing has "decreased". Patient instructed to place a dressing on the site and change it this evening to assess the amount of blood and oozing. Patient states understanding and is aware to call if the bleeding increases or their is a new onset of pain to umbilical site. Melissa Cross, APNP aware . Patient instructed to call with any additional changes ,questions or concerns.

## 2016-05-14 ENCOUNTER — Telehealth: Payer: Self-pay | Admitting: Gynecologic Oncology

## 2016-05-14 DIAGNOSIS — C541 Malignant neoplasm of endometrium: Secondary | ICD-10-CM

## 2016-05-14 NOTE — Telephone Encounter (Signed)
Informed patient of stage IIIC disease with nodal involvement.  Will first obtain CT chest, abdomen and pelvis to stage.  Will refer to medical oncology and radiation oncology.  Recommend sandwich chemotherapy and radiation:  3 cycles of carboplatin (AUC 6) and Taxol (175mg /m2) q 3 weekly followed by radiation to pelvis, followed by 3 additional cycles of carboplatin and paclitaxel chemotherapy.  Donaciano Eva, MD

## 2016-05-15 ENCOUNTER — Other Ambulatory Visit: Payer: Self-pay | Admitting: Gynecologic Oncology

## 2016-05-15 ENCOUNTER — Telehealth: Payer: Self-pay | Admitting: *Deleted

## 2016-05-15 DIAGNOSIS — C541 Malignant neoplasm of endometrium: Secondary | ICD-10-CM

## 2016-05-15 NOTE — Telephone Encounter (Signed)
Notified Pt of scheduled CT appointment on 05/22/16 @ 1:00pm. Pt was instructed to come to [facility] and go to the radiology department and to have nothing to eat or drink 4 hours prior. Pt agreed with time and date of appointment.

## 2016-05-21 ENCOUNTER — Other Ambulatory Visit: Payer: Self-pay | Admitting: Gynecologic Oncology

## 2016-05-21 DIAGNOSIS — C541 Malignant neoplasm of endometrium: Secondary | ICD-10-CM

## 2016-05-22 ENCOUNTER — Ambulatory Visit (HOSPITAL_COMMUNITY)
Admission: RE | Admit: 2016-05-22 | Discharge: 2016-05-22 | Disposition: A | Discharge status: Home / Self Care | Payer: PPO | Source: Ambulatory Visit | Attending: Gynecologic Oncology | Admitting: Gynecologic Oncology

## 2016-05-22 ENCOUNTER — Encounter (HOSPITAL_COMMUNITY): Payer: Self-pay

## 2016-05-22 DIAGNOSIS — R911 Solitary pulmonary nodule: Secondary | ICD-10-CM | POA: Diagnosis not present

## 2016-05-22 DIAGNOSIS — K449 Diaphragmatic hernia without obstruction or gangrene: Secondary | ICD-10-CM | POA: Insufficient documentation

## 2016-05-22 DIAGNOSIS — K8020 Calculus of gallbladder without cholecystitis without obstruction: Secondary | ICD-10-CM | POA: Insufficient documentation

## 2016-05-22 DIAGNOSIS — I700 Atherosclerosis of aorta: Secondary | ICD-10-CM | POA: Diagnosis not present

## 2016-05-22 DIAGNOSIS — C541 Malignant neoplasm of endometrium: Secondary | ICD-10-CM

## 2016-05-22 MED ORDER — DIATRIZOATE MEGLUMINE & SODIUM 66-10 % PO SOLN
30.0000 mL | ORAL | Status: DC | PRN
  Administered 2016-05-22: 30 mL via ORAL
  Filled 2016-05-22: qty 30

## 2016-05-22 MED ORDER — IOPAMIDOL (ISOVUE-300) INJECTION 61%
100.0000 mL | Freq: Once | INTRAVENOUS | Status: AC | PRN
  Administered 2016-05-22: 100 mL via INTRAVENOUS

## 2016-05-23 ENCOUNTER — Telehealth: Payer: Self-pay

## 2016-05-26 ENCOUNTER — Other Ambulatory Visit (HOSPITAL_COMMUNITY)
Admission: RE | Admit: 2016-05-26 | Discharge: 2016-05-26 | Disposition: A | Discharge status: Home / Self Care | Payer: PPO | Source: Ambulatory Visit | Attending: Gynecologic Oncology | Admitting: Gynecologic Oncology

## 2016-05-26 ENCOUNTER — Encounter: Payer: Self-pay | Admitting: Gynecologic Oncology

## 2016-05-26 DIAGNOSIS — C541 Malignant neoplasm of endometrium: Secondary | ICD-10-CM | POA: Insufficient documentation

## 2016-05-26 NOTE — Progress Notes (Signed)
Gynecologic Oncology Multi-Disciplinary Disposition Conference Note  Date of the Conference: May 26, 2016  Patient Name: Teresa Norris  Referring Provider: Dr. Simona Huh Primary GYN Oncologist: Dr. Everitt Amber  Stage/Disposition:  Stage IIIC Endometrioid adenocarcinoma.  Disposition is to sandwich chemotherapy and radiation including 3 cycle of carboplatin (AUC 6) and Taxol (175 mg/m2) q 3 weekly followed by radiation to the pelvis with 3 cycles of chemo to follow.   This Multidisciplinary conference took place involving physicians from [place], [employer], Radiation Oncology, Pathology, Radiology along with the Gynecologic Oncology Nurse Practitioner and RN.  Comprehensive assessment of the patient's malignancy, staging, need for surgery, chemotherapy, radiation therapy, and need for further testing were reviewed. Supportive measures, both inpatient and following discharge were also discussed. The recommended plan of care is documented. Greater than 35 minutes were spent correlating and coordinating this patient's care.

## 2016-05-30 ENCOUNTER — Encounter: Payer: Self-pay | Admitting: Gynecologic Oncology

## 2016-05-30 ENCOUNTER — Encounter (HOSPITAL_COMMUNITY): Payer: Self-pay

## 2016-05-30 ENCOUNTER — Ambulatory Visit: Payer: PPO | Attending: Gynecologic Oncology | Admitting: Gynecologic Oncology

## 2016-05-30 VITALS — BP 137/77 | HR 74 | Temp 98.2°F | Resp 18 | Ht 65.0 in | Wt 166.3 lb

## 2016-05-30 DIAGNOSIS — R911 Solitary pulmonary nodule: Secondary | ICD-10-CM

## 2016-05-30 DIAGNOSIS — C541 Malignant neoplasm of endometrium: Secondary | ICD-10-CM | POA: Diagnosis not present

## 2016-05-30 DIAGNOSIS — F419 Anxiety disorder, unspecified: Secondary | ICD-10-CM | POA: Insufficient documentation

## 2016-05-30 DIAGNOSIS — Z7189 Other specified counseling: Secondary | ICD-10-CM | POA: Diagnosis not present

## 2016-05-30 DIAGNOSIS — G4750 Parasomnia, unspecified: Secondary | ICD-10-CM | POA: Insufficient documentation

## 2016-05-30 DIAGNOSIS — I209 Angina pectoris, unspecified: Secondary | ICD-10-CM | POA: Insufficient documentation

## 2016-05-30 DIAGNOSIS — H269 Unspecified cataract: Secondary | ICD-10-CM | POA: Insufficient documentation

## 2016-05-30 DIAGNOSIS — M1990 Unspecified osteoarthritis, unspecified site: Secondary | ICD-10-CM | POA: Diagnosis not present

## 2016-05-30 DIAGNOSIS — M6289 Other specified disorders of muscle: Secondary | ICD-10-CM | POA: Diagnosis not present

## 2016-05-30 DIAGNOSIS — C775 Secondary and unspecified malignant neoplasm of intrapelvic lymph nodes: Secondary | ICD-10-CM

## 2016-05-30 DIAGNOSIS — C779 Secondary and unspecified malignant neoplasm of lymph node, unspecified: Secondary | ICD-10-CM | POA: Diagnosis not present

## 2016-05-30 NOTE — Progress Notes (Signed)
Follow-up Note: Gyn-Onc   Teresa Norris 82 y.o. female  Chief Complaint  Patient presents with  . endometrial cancer    metastatic to lymph nodes    Assessment : 82 year old woman with stage IIIC1 grade 2 endometrioid endometrial cancer. Plan:   We reviewed the pathology and staging information. We reviewed her CT scan result from 05/22/16 which showed no evidence for distant metastases (she does have a lung lesion consistent with mucoid impaction that requires followup).   I recommended adjuvant therapy with chemotherapy and radiation. I am recommending a sandwich approach. I recommend 3 cycles of carboplatin and paclitaxel chemotherapy followed by external beam and brachytherapy followed by 3 additional cycles of chemotherapy. We reviewed anticipated course of therapy and side effects.   HPI: 82 year old white female seen in consultation request of Dr. Simona Huh regarding management of a newly diagnosed endometrial carcinoma. Proctoscopy 6 weeks ago the patient had her first onset of abnormal bleeding and 40 years. This abated but approximately a month later she began bleeding again. She was then seen by Dr.Varnado who obtained an ultrasound showing a normal uterus and ovaries. Endometrial biopsy was interpreted as adenocarcinoma. The patient continued to have some light bleeding. She denies any pelvic pain.  She has a past history of polio as a child and she claims that she has prolonged slow recovery following anesthesia.  Interval Hx:  The patient underwent robotic assisted total hysterectomy, BSO, SLN biopsy on 05/22/16. The surgery was uneventful. Pathology revealed a 6cm grade 2 tumor that was 100% invaded through the myometrium (4.3 of 4.3cm) with LVSI present. The adnexa and cervix were negative for tumor, however there were 3 pelvic lymph nodes positive for macrometastatic disease (>58mm).  Postoperative CT scan on 05/22/16 showed no evidence for distant metastases. It did show a 48mm  right lower lobe indeterminate pulmonary nodules with probable mucoid impaction of several terminal bronchioles. Recommended follow-up in 6 months.  Postoperatively she has done well with no bleeding, minimal pain and occasional fatigue.   Review of Systems:10 point review of systems is negative except as noted in interval history.   Vitals: Blood pressure 137/77, pulse 74, temperature 98.2 F (36.8 C), temperature source Oral, resp. rate 18, height 5\' 5"  (1.651 m), weight 166 lb 4.8 oz (75.433 kg), SpO2 97 %.  Physical Exam: General : The patient is a healthy woman in no acute distress.  HEENT: normocephalic, extraoccular movements normal; neck is supple without thyromegally  Lynphnodes: Supraclavicular and inguinal nodes not enlarged  Abdomen: Soft, non-tender, no ascites, no organomegally, no masses, no hernias Well healed incisions. Pelvic:  EGBUS: Normal female  Vagina: Vaginal cuff healing well and in tact. Urethra and Bladder: Normal, non-tender  Bi-manual examination: Non-tender; no adenxal masses or nodularity  Rectal: normal sphincter tone, no masses, no blood  Lower extremities: No edema or varicosities. Normal range of motion      Allergies  Allergen Reactions  . Aspirin Anaphylaxis and Other (See Comments)    STOMACH PAIN/BLEEDING ULCERS  . E-Mycin [Erythromycin] Other (See Comments)    Stomach problems    Past Medical History  Diagnosis Date  . Parasomnia     Night terrors  . Polio     At age 59 or 5 after a tonsillectomy  . Muscle fatigue     Post polio  . Anxiety   . Cataract     Both eyes  . Complication of anesthesia     states that anesthesia makes her  groggy for about six weeks after surgery.  Hx. of polio   . Anginal pain (HCC)     was not cardiac related  . Arthritis   . Cancer [facility])     endometrial cancer  . Costochondritis     Past Surgical History  Procedure Laterality Date  . Eye surgery Bilateral Lt 11/12   Rt 12/12    CATARACT  .  Leg surgery Left 1988  . Foot surgery  1938  . Breast surgery      BENIGN NODES  . 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 Robotic assisted total hysterectomy with bilateral salpingo oopherectomy N/A 05/06/2016    Procedure: XI ROBOTIC ASSISTED TOTAL HYSTERECTOMY WITH BILATERAL SALPINGO OOPHORECTOMY;  Surgeon: Everitt Amber, MD;  Location: [facility];  Service: Gynecology;  Laterality: N/A;  . Lymph node biopsy N/A 05/06/2016    Procedure: Sentinel LYMPH NODE BIOPSY right total lymphandenapathy;  Surgeon: Everitt Amber, MD;  Location: [facility];  Service: Gynecology;  Laterality: N/A;    Current Outpatient Prescriptions  Medication Sig Dispense Refill  . clonazePAM (KLONOPIN) 0.5 MG tablet TAKE 1 TABLET BY MOUTH AT NIGHT 90 tablet 0  . docusate sodium (COLACE) 100 MG capsule Take 1 capsule (100 mg total) by mouth 2 (two) times daily. 30 capsule 0  . levothyroxine (SYNTHROID, LEVOTHROID) 100 MCG tablet Take 100 mcg by mouth daily before breakfast.    . polyvinyl alcohol (LIQUIFILM TEARS) 1.4 % ophthalmic solution Place 2 drops into both eyes daily.     Marland Kitchen acetaminophen (TYLENOL) 500 MG tablet Take 500-1,000 mg by mouth every 6 (six) hours as needed for mild pain.     No current facility-administered medications for this visit.    Social History   Social History  . Marital Status: Married    Spouse Name: Teresa Norris  . Number of Children: 3  . Years of Education: 14   Occupational History  . Not on file.   Social History Main Topics  . Smoking status: Never Smoker   . Smokeless tobacco: Never Used  . Alcohol Use: 2.0 - 2.5 oz/week    4-5 Standard drinks or equivalent per week     Comment: WINE   . Drug Use: No  . Sexual Activity: Not on file   Other Topics Concern  . Not on file   Social History Narrative   Patient is married Teresa Norris) and lives at home with her husband.   Patient has three children.   Patient is retired.   Patient has a college education.   Patient drinks caffeine  once in a while.    Family History  Problem Relation Age of Onset  . Cancer Brother   . Parkinson's disease Father   . Acute myelogenous leukemia       30 minutes of direct face to face counseling time was spent with the patient. This included discussion about prognosis, therapy recommendations and postoperative side effects and are beyond the scope of routine postoperative care.  Donaciano Eva, MD 05/30/2016, 12:57 PM

## 2016-05-30 NOTE — Patient Instructions (Addendum)
Your appointments have been scheduled for chemotherapy education July 5 , 2017 and  Dr Marko Plume  July 7 , 2017 with labs prior to your consultation visit . Dr Marko Plume will update Dr Everitt Amber when you have finished your cycles and to schedule a follow up visit. Please call with changes , questions or concerns.  Dr Clabe Seal office has been contacted

## 2016-06-04 ENCOUNTER — Other Ambulatory Visit: Payer: PPO

## 2016-06-05 ENCOUNTER — Other Ambulatory Visit: Payer: Self-pay | Admitting: Oncology

## 2016-06-05 DIAGNOSIS — C541 Malignant neoplasm of endometrium: Secondary | ICD-10-CM

## 2016-06-06 ENCOUNTER — Encounter: Payer: Self-pay | Admitting: Oncology

## 2016-06-06 ENCOUNTER — Other Ambulatory Visit (HOSPITAL_BASED_OUTPATIENT_CLINIC_OR_DEPARTMENT_OTHER): Payer: PPO

## 2016-06-06 ENCOUNTER — Telehealth: Payer: Self-pay | Admitting: Oncology

## 2016-06-06 ENCOUNTER — Ambulatory Visit (HOSPITAL_BASED_OUTPATIENT_CLINIC_OR_DEPARTMENT_OTHER): Payer: PPO | Admitting: Oncology

## 2016-06-06 VITALS — BP 154/91 | HR 73 | Temp 98.2°F | Resp 17 | Ht 65.0 in | Wt 168.4 lb

## 2016-06-06 DIAGNOSIS — Z8612 Personal history of poliomyelitis: Secondary | ICD-10-CM | POA: Diagnosis not present

## 2016-06-06 DIAGNOSIS — C541 Malignant neoplasm of endometrium: Secondary | ICD-10-CM | POA: Diagnosis not present

## 2016-06-06 DIAGNOSIS — E039 Hypothyroidism, unspecified: Secondary | ICD-10-CM | POA: Diagnosis not present

## 2016-06-06 LAB — CBC WITH DIFFERENTIAL/PLATELET
BASO%: 0.6 % (ref 0.0–2.0)
BASOS ABS: 0 10*3/uL (ref 0.0–0.1)
EOS%: 4.4 % (ref 0.0–7.0)
Eosinophils Absolute: 0.2 10*3/uL (ref 0.0–0.5)
HCT: 45.9 % (ref 34.8–46.6)
HGB: 15.6 g/dL (ref 11.6–15.9)
LYMPH%: 17.8 % (ref 14.0–49.7)
MCH: 30.2 pg (ref 25.1–34.0)
MCHC: 34 g/dL (ref 31.5–36.0)
MCV: 89 fL (ref 79.5–101.0)
MONO#: 0.6 10*3/uL (ref 0.1–0.9)
MONO%: 10.1 % (ref 0.0–14.0)
NEUT#: 3.7 10*3/uL (ref 1.5–6.5)
NEUT%: 67.1 % (ref 38.4–76.8)
Platelets: 200 10*3/uL (ref 145–400)
RBC: 5.16 10*6/uL (ref 3.70–5.45)
RDW: 13.2 % (ref 11.2–14.5)
WBC: 5.4 10*3/uL (ref 3.9–10.3)
lymph#: 1 10*3/uL (ref 0.9–3.3)

## 2016-06-06 LAB — COMPREHENSIVE METABOLIC PANEL
ALT: 23 U/L (ref 0–55)
AST: 19 U/L (ref 5–34)
Albumin: 4 g/dL (ref 3.5–5.0)
Alkaline Phosphatase: 81 U/L (ref 40–150)
Anion Gap: 10 mEq/L (ref 3–11)
BUN: 19.6 mg/dL (ref 7.0–26.0)
CHLORIDE: 104 meq/L (ref 98–109)
CO2: 25 mEq/L (ref 22–29)
Calcium: 9.6 mg/dL (ref 8.4–10.4)
Creatinine: 0.8 mg/dL (ref 0.6–1.1)
EGFR: 64 mL/min/{1.73_m2} — AB (ref >90)
GLUCOSE: 88 mg/dL (ref 70–140)
POTASSIUM: 4.6 meq/L (ref 3.5–5.1)
SODIUM: 139 meq/L (ref 136–145)
Total Bilirubin: 0.57 mg/dL (ref 0.20–1.20)
Total Protein: 7.2 g/dL (ref 6.4–8.3)

## 2016-06-06 NOTE — Patient Instructions (Signed)
First chemo with carboplatin and low dose weekly taxol will be on Thurs July 20 at 1215. You will have a lab appointment prior. You need to take 2 doses of steroids before first taxol, one dose with food 12 hrs prior and one dose with food 6 hrs prior. The steroid is decadron (dexamethasone) 4 mg tablets, five tablets with food each dose for total 20 mg.   Doses will be at midnight on Wed July 19 and 6 AM on Thurs July 20.   We will send prescriptions for the decadron and for nausea medicines to your pharmacy. Nausea medicines will be zofran (ondansetron) which does not make you drowsy, and compazine (prochlorperazine) 10 mg, which may or may not make you drowsy.  You can call any time if questions or concerns [phone]
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
## 2016-06-06 NOTE — Telephone Encounter (Signed)
appt made and avs printed °

## 2016-06-08 DIAGNOSIS — E039 Hypothyroidism, unspecified: Secondary | ICD-10-CM | POA: Insufficient documentation

## 2016-06-08 DIAGNOSIS — Z8612 Personal history of poliomyelitis: Secondary | ICD-10-CM | POA: Insufficient documentation

## 2016-06-10 ENCOUNTER — Telehealth: Payer: Self-pay

## 2016-06-10 DIAGNOSIS — C541 Malignant neoplasm of endometrium: Secondary | ICD-10-CM

## 2016-06-10 MED ORDER — PROCHLORPERAZINE MALEATE 10 MG PO TABS: 10.0000 mg | ORAL_TABLET | Freq: Four times a day (QID) | ORAL | Status: DC | PRN

## 2016-06-10 MED ORDER — ONDANSETRON HCL 8 MG PO TABS: 8.0000 mg | ORAL_TABLET | Freq: Three times a day (TID) | ORAL | Status: DC | PRN

## 2016-06-10 MED ORDER — DEXAMETHASONE 4 MG PO TABS: ORAL_TABLET | ORAL | Status: DC

## 2016-06-10 NOTE — Telephone Encounter (Signed)
-----   Message from Gordy Levan, MD sent at 06/08/2016 11:27 AM EDT ----- Decadron 4 mg   Five tabs with food 12 hrs and 6 hrs prior to first taxol. #20 RN please call shortly prior to first chemo July 20 to review steroid instructions If no problems during first taxol infusion, she will need to be instructed to decrease to five tabs with food 12 hrs prior only.  zofran 8 mg   One every 8 hrs prn nausea  Will not make drowsy #30  1 RF  Compazine 10 mg    One every 6 hrs prn nausea. May make drowsy. #30  Thank you

## 2016-06-10 NOTE — Telephone Encounter (Signed)
Sent prescriptions to patients pharmacy as noted below.

## 2016-06-11 NOTE — Progress Notes (Signed)
Sent Prior Authorization form for Zofran to Managed Care dept.

## 2016-06-11 NOTE — Telephone Encounter (Signed)
Sent prior authorization form for Zofran to managed care department.

## 2016-06-12 NOTE — Telephone Encounter (Signed)
Error

## 2016-06-16 ENCOUNTER — Encounter: Payer: Self-pay | Admitting: Oncology

## 2016-06-16 NOTE — Progress Notes (Signed)
sent prior auth for ondansestron-approved 06/11/16-06/10/2116 I sent to medical recds

## 2016-06-17 ENCOUNTER — Telehealth: Payer: Self-pay

## 2016-06-17 NOTE — Telephone Encounter (Signed)
-----   Message from Gordy Levan, MD sent at 06/08/2016 11:27 AM EDT ----- Decadron 4 mg   Five tabs with food 12 hrs and 6 hrs prior to first taxol. #20 RN please call shortly prior to first chemo July 20 to review steroid instructions If no problems during first taxol infusion, she will need to be instructed to decrease to five tabs with food 12 hrs prior only.  zofran 8 mg   One every 8 hrs prn nausea  Will not make drowsy #30  1 RF  Compazine 10 mg    One every 6 hrs prn nausea. May make drowsy. #30  Thank you

## 2016-06-17 NOTE — Telephone Encounter (Signed)
Reviewed decadron premedication with Ms Teresa Norris as noted below by Dr. Marko Plume.

## 2016-06-19 ENCOUNTER — Other Ambulatory Visit (HOSPITAL_BASED_OUTPATIENT_CLINIC_OR_DEPARTMENT_OTHER): Payer: PPO

## 2016-06-19 ENCOUNTER — Ambulatory Visit: Payer: PPO

## 2016-06-19 ENCOUNTER — Ambulatory Visit (HOSPITAL_COMMUNITY)
Admission: RE | Admit: 2016-06-19 | Discharge: 2016-06-19 | Disposition: A | Discharge status: Home / Self Care | Payer: PPO | Source: Ambulatory Visit | Attending: Nurse Practitioner | Admitting: Nurse Practitioner

## 2016-06-19 ENCOUNTER — Ambulatory Visit (HOSPITAL_BASED_OUTPATIENT_CLINIC_OR_DEPARTMENT_OTHER): Payer: PPO | Admitting: Nurse Practitioner

## 2016-06-19 ENCOUNTER — Other Ambulatory Visit: Payer: Self-pay | Admitting: Oncology

## 2016-06-19 DIAGNOSIS — C541 Malignant neoplasm of endometrium: Secondary | ICD-10-CM

## 2016-06-19 DIAGNOSIS — K625 Hemorrhage of anus and rectum: Secondary | ICD-10-CM

## 2016-06-19 DIAGNOSIS — R58 Hemorrhage, not elsewhere classified: Secondary | ICD-10-CM | POA: Diagnosis not present

## 2016-06-19 DIAGNOSIS — I998 Other disorder of circulatory system: Secondary | ICD-10-CM | POA: Insufficient documentation

## 2016-06-19 DIAGNOSIS — R51 Headache: Secondary | ICD-10-CM | POA: Diagnosis not present

## 2016-06-19 LAB — CBC WITH DIFFERENTIAL/PLATELET
BASO%: 0 % (ref 0.0–2.0)
Basophils Absolute: 0 10*3/uL (ref 0.0–0.1)
EOS ABS: 0 10*3/uL (ref 0.0–0.5)
EOS%: 0 % (ref 0.0–7.0)
HEMATOCRIT: 46.7 % — AB (ref 34.8–46.6)
HGB: 16.1 g/dL — ABNORMAL HIGH (ref 11.6–15.9)
LYMPH#: 0.5 10*3/uL — AB (ref 0.9–3.3)
LYMPH%: 9.5 % — ABNORMAL LOW (ref 14.0–49.7)
MCH: 30.2 pg (ref 25.1–34.0)
MCHC: 34.5 g/dL (ref 31.5–36.0)
MCV: 87.6 fL (ref 79.5–101.0)
MONO#: 0 10*3/uL — AB (ref 0.1–0.9)
MONO%: 0.6 % (ref 0.0–14.0)
NEUT%: 89.9 % — AB (ref 38.4–76.8)
NEUTROS ABS: 4.5 10*3/uL (ref 1.5–6.5)
PLATELETS: 217 10*3/uL (ref 145–400)
RBC: 5.33 10*6/uL (ref 3.70–5.45)
RDW: 13.2 % (ref 11.2–14.5)
WBC: 5 10*3/uL (ref 3.9–10.3)

## 2016-06-19 LAB — COMPREHENSIVE METABOLIC PANEL
ALBUMIN: 4.1 g/dL (ref 3.5–5.0)
ALK PHOS: 83 U/L (ref 40–150)
ALT: 31 U/L (ref 0–55)
ANION GAP: 11 meq/L (ref 3–11)
AST: 22 U/L (ref 5–34)
BILIRUBIN TOTAL: 0.48 mg/dL (ref 0.20–1.20)
BUN: 21.5 mg/dL (ref 7.0–26.0)
CALCIUM: 9.7 mg/dL (ref 8.4–10.4)
CO2: 20 mEq/L — ABNORMAL LOW (ref 22–29)
CREATININE: 0.9 mg/dL (ref 0.6–1.1)
Chloride: 104 mEq/L (ref 98–109)
EGFR: 61 mL/min/{1.73_m2} — ABNORMAL LOW (ref >90)
Glucose: 167 mg/dl — ABNORMAL HIGH (ref 70–140)
Potassium: 4.3 mEq/L (ref 3.5–5.1)
Sodium: 136 mEq/L (ref 136–145)
TOTAL PROTEIN: 7.8 g/dL (ref 6.4–8.3)

## 2016-06-19 NOTE — Progress Notes (Signed)
Reported to Dr. Marko Plume patient complaining of bright red bleeding this am with bowel movement, bruising noted to left forehead and she is not sure how she got the bruise and a slight headache. [name], NP to see.  Infusion canceled for today, taken via wheelchair to radiology for CT.

## 2016-06-20 ENCOUNTER — Telehealth: Payer: Self-pay

## 2016-06-20 ENCOUNTER — Encounter: Payer: Self-pay | Admitting: Nurse Practitioner

## 2016-06-20 DIAGNOSIS — R58 Hemorrhage, not elsewhere classified: Secondary | ICD-10-CM | POA: Insufficient documentation

## 2016-06-20 DIAGNOSIS — K625 Hemorrhage of anus and rectum: Secondary | ICD-10-CM | POA: Insufficient documentation

## 2016-06-20 NOTE — Progress Notes (Signed)
SYMPTOM MANAGEMENT CLINIC    Chief Complaint: Bruise, rectal bleeding  HPI:  Teresa Norris 82 y.o. female diagnosed with endometrial cancer.  Presented to the [facility] today to initiate carboplatin/Taxol chemotherapy regimen.  Patient presented to the [facility] today.  Receive her first cycle of chemotherapy; but was noted to have a bruise to her left temple and new complaint of bright red rectal bleeding.    No history exists.    Review of Systems  HENT:       Bruised her left temple  Gastrointestinal:       Bright red rectal bleeding 1  All other systems reviewed and are negative.   Past Medical History  Diagnosis Date  . Parasomnia     Night terrors  . Polio     At age 89 or 5 after a tonsillectomy  . Muscle fatigue     Post polio  . Anxiety   . Cataract     Both eyes  . Complication of anesthesia     states that anesthesia makes her groggy for about six weeks after surgery.  Hx. of polio   . Anginal pain (HCC)     was not cardiac related  . Arthritis   . Cancer [facility])     endometrial cancer  . Costochondritis     Past Surgical History  Procedure Laterality Date  . Eye surgery Bilateral Lt 11/12   Rt 12/12    CATARACT  . Leg surgery Left 1988  . Foot surgery  1938  . Breast surgery      BENIGN NODES  . 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 Robotic assisted total hysterectomy with bilateral salpingo oopherectomy N/A 05/06/2016    Procedure: XI ROBOTIC ASSISTED TOTAL HYSTERECTOMY WITH BILATERAL SALPINGO OOPHORECTOMY;  Surgeon: Everitt Amber, MD;  Location: [facility];  Service: Gynecology;  Laterality: N/A;  . 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    is allergic to aspirin and e-mycin.    Medication List       This list is accurate as of: 06/19/16 11:59 PM.  Always use your most recent med list.               acetaminophen 500 MG tablet  Commonly known as:  TYLENOL  Take 500-1,000 mg by mouth every 6 (six) hours as needed for mild pain.     clonazePAM 0.5 MG tablet  Commonly known as:  KLONOPIN  TAKE 1 TABLET BY MOUTH AT NIGHT     dexamethasone 4 MG tablet  Commonly known as:  DECADRON  Take 5 tablets  with food 12 hrs and 6 hrs prior to first Taxol Chemotherapy.     docusate sodium 100 MG capsule  Commonly known as:  COLACE  Take 1 capsule (100 mg total) by mouth 2 (two) times daily.     levothyroxine 100 MCG tablet  Commonly known as:  SYNTHROID, LEVOTHROID  Take 100 mcg by mouth daily before breakfast.     ondansetron 8 MG tablet  Commonly known as:  ZOFRAN  Take 1 tablet (8 mg  total) by mouth every 8 (eight) hours as needed for nausea. Will not make drowsy.     polyvinyl alcohol 1.4 % ophthalmic solution  Commonly known as:  LIQUIFILM TEARS  Place 2 drops into both eyes daily.     prochlorperazine 10 MG tablet  Commonly known as:  COMPAZINE  Take 1 tablet (10 mg total) by mouth every 6 (six) hours as needed for nausea. May make drowsy.         PHYSICAL EXAMINATION  Oncology Vitals 06/19/2016 06/06/2016  Height - 165 cm  Weight - 76.386 kg  Weight (lbs) - 168 lbs 6 oz  BMI (kg/m2) - 28.02 kg/m2  Temp 98 98.2  Pulse 95 73  Resp 17 17  SpO2 95 95  BSA (m2) - 1.87 m2   BP Readings from Last 2 Encounters:  06/19/16 154/90  06/06/16 154/91    Physical Exam  Constitutional: She is oriented to person, place, and time and well-developed, well-nourished, and in no distress.  HENT:  Head: Normocephalic.  Patient has a small, resolving/old bruises to the left temple.  Eyes:  Conjunctivae and EOM are normal. Pupils are equal, round, and reactive to light. Right eye exhibits no discharge. Left eye exhibits no discharge. No scleral icterus.  Neck: Normal range of motion. Neck supple.  Pulmonary/Chest: Effort normal. No respiratory distress.  Musculoskeletal: Normal range of motion. She exhibits no edema or tenderness.  Neurological: She is alert and oriented to person, place, and time.  Skin: Skin is warm and dry. No rash noted. No erythema. No pallor.  Left temple bruise.  Psychiatric: Affect normal.  Nursing note and vitals reviewed.   LABORATORY DATA:. Appointment on 06/19/2016  Component Date Value Ref Range Status  . WBC 06/19/2016 5.0  3.9 - 10.3 10e3/uL Final  . NEUT# 06/19/2016 4.5  1.5 - 6.5 10e3/uL Final  . HGB 06/19/2016 16.1* 11.6 - 15.9 g/dL Final  . HCT 06/19/2016 46.7* 34.8 - 46.6 % Final  . Platelets 06/19/2016 217  145 - 400 10e3/uL Final  . MCV 06/19/2016 87.6  79.5 - 101.0 fL Final  . MCH 06/19/2016 30.2  25.1 - 34.0 pg Final  . MCHC 06/19/2016 34.5  31.5 - 36.0 g/dL Final  . RBC 06/19/2016 5.33  3.70 - 5.45 10e6/uL Final  . RDW 06/19/2016 13.2  11.2 - 14.5 % Final  . lymph# 06/19/2016 0.5* 0.9 - 3.3 10e3/uL Final  . MONO# 06/19/2016 0.0* 0.1 - 0.9 10e3/uL Final  . Eosinophils Absolute 06/19/2016 0.0  0.0 - 0.5 10e3/uL Final  . Basophils Absolute 06/19/2016 0.0  0.0 - 0.1 10e3/uL Final  . NEUT% 06/19/2016 89.9* 38.4 - 76.8 % Final  . LYMPH% 06/19/2016 9.5* 14.0 - 49.7 % Final  . MONO% 06/19/2016 0.6  0.0 - 14.0 % Final  . EOS% 06/19/2016 0.0  0.0 - 7.0 % Final  . BASO% 06/19/2016 0.0  0.0 - 2.0 % Final  . Sodium 06/19/2016 136  136 - 145 mEq/L Final  . Potassium 06/19/2016 4.3  3.5 - 5.1 mEq/L Final  . Chloride 06/19/2016 104  98 - 109 mEq/L Final  . CO2 06/19/2016 20* 22 - 29 mEq/L Final  . Glucose 06/19/2016 167* 70 - 140 mg/dl Final   Glucose reference range is for nonfasting patients. Fasting glucose reference range is 70- 100.    Marland Kitchen BUN 06/19/2016 21.5  7.0 - 26.0 mg/dL Final  . Creatinine 06/19/2016 0.9  0.6 - 1.1 mg/dL Final  . Total Bilirubin 06/19/2016  0.48  0.20 - 1.20 mg/dL Final  . Alkaline Phosphatase 06/19/2016 83  40 - 150 U/L Final  . AST 06/19/2016 22  5 - 34 U/L Final  . ALT 06/19/2016 31  0 - 55 U/L Final  . Total Protein 06/19/2016 7.8  6.4 - 8.3 g/dL Final  . Albumin 06/19/2016 4.1  3.5 - 5.0 g/dL Final  . Calcium 06/19/2016 9.7  8.4 - 10.4 mg/dL Final  . Anion Gap 06/19/2016 11  3 - 11 mEq/L Final  . EGFR 06/19/2016 61* >90 ml/min/1.73 m2 Final   eGFR is calculated using the CKD-EPI Creatinine Equation (2009)    RADIOGRAPHIC STUDIES: Ct Head Wo Contrast  06/19/2016  CLINICAL DATA:  82 y/o F; history of uterine cancer 6/17. There are single of the left temporal area with headaches. EXAM: CT HEAD WITHOUT CONTRAST TECHNIQUE: Contiguous axial images were obtained from the base of the skull through the vertex without intravenous contrast. COMPARISON:  MRI of the brain dated 02/10/2015. FINDINGS: Brain: No evidence of large acute infarct, mass effect, intracranial hemorrhage, or ventriculomegaly. Hypodensity in the right periatrial white matter corresponding to prior MRI FLAIR signal abnormality likely represents minimal chronic microvascular ischemic changes. Vascular: No hyperdense vessel or unexpected calcification. Skull: Negative for fracture or focal lesion. Sinuses/Orbits: Bilateral intra-ocular lens replacement. The visualized paranasal sinuses and mastoids are normally aerated. Other: No gross soft tissue hematoma of the scalp. IMPRESSION: 1. No acute intracranial abnormality. Minimal chronic microvascular ischemic changes. 2. No scalp mass or hematoma is identified. Electronically Signed   By: Kristine Garbe M.D.   On: 06/19/2016 14:29    ASSESSMENT/PLAN:    Endometrial cancer [facility]) Patient presented to the [facility] today to receive her first cycle of carboplatin/Taxol chemotherapy  regimen.  However, patient presented to the [facility] with the complaint of one episode of bright red rectal bleeding and also noticed a new bruise to her left temple.  She states she occasionally has headaches and feels stressed today is his first cycle of chemotherapy.  See further notes for details.  Reviewed all findings with Dr. Marko Plume; and decision was made to hold the first cycle of chemotherapy till next week.  Patient will return for labs, visit, and her for cycle of chemotherapy on 06/26/2016.  Ecchymosis Patient states that she just noticed a bruise to her left temple earlier this morning.  She denies any known injury or trauma.  She states that she occasionally has headaches; and states that she feels very stressed today since she was planning to start chemotherapy.  She denies any vision changes or other neurological issues.  Exam today reveals neuro intact; and right temple with healing/old bruise to the left temple region.  There is no edema, tenderness, or warmth to the site.  Head CT obtained today revealed no acute findings.  Patient was advised to call/return of her directly to the emergency department for any worsening symptoms.  Bright red rectal bleeding Patient states that she had one episode of bright red rectal bleeding earlier today which had a bowel movement.  Patient states she has known chronic external hemorrhoids; but did not believe that the bleeding came from an external hemorrhoids today.  She is unaware of any internal hemorrhoids.  Blood counts obtained today were stable.  Patient was advised to call/return or go directly to the emergency department for any worsening rectal bleeding whatsoever.   Patient stated understanding of all instructions; and was in agreement with this plan of care.  The patient knows to call the clinic with any problems, questions or concerns.   Total time spent with patient was 25 minutes;  with greater than 75 percent of  that time spent in face to face counseling regarding patient's symptoms,  and coordination of care and follow up.  Disclaimer:This dictation was prepared with Dragon/digital dictation along with [employer]. Any transcriptional errors that result from this process are unintentional.  Drue Second, NP 06/20/2016

## 2016-06-20 NOTE — Telephone Encounter (Signed)
-----   Message from Gordy Levan, MD sent at 06/20/2016 11:28 AM EDT ----- Labs seen and need follow up:   Please let patient and family know, if C.Berniece Salines has had been in touch with her, that head CT did not show any bleeding moreso than the bruise on her head. Please ask about rectal bleeding and hemorrhoids, if more than sitz baths/ Tucks/ OTC needed, we can do anusol HC. She needs sitz bath on toilet, should not try to get in and out of a tub.    thanks

## 2016-06-20 NOTE — Assessment & Plan Note (Signed)
Patient states that she just noticed a bruise to her left temple earlier this morning.  She denies any known injury or trauma.  She states that she occasionally has headaches; and states that she feels very stressed today since she was planning to start chemotherapy.  She denies any vision changes or other neurological issues.  Exam today reveals neuro intact; and right temple with healing/old bruise to the left temple region.  There is no edema, tenderness, or warmth to the site.  Head CT obtained today revealed no acute findings.  Patient was advised to call/return of her directly to the emergency department for any worsening symptoms.

## 2016-06-20 NOTE — Assessment & Plan Note (Signed)
Patient presented to the [place] today to receive her first cycle of carboplatin/Taxol chemotherapy regimen.  However, patient presented to the [facility] with the complaint of one episode of bright red rectal bleeding and also noticed a new bruise to her left temple.  She states she occasionally has headaches and feels stressed today is his first cycle of chemotherapy.  See further notes for details.  Reviewed all findings with Dr. Marko Plume; and decision was made to hold the first cycle of chemotherapy till next week.  Patient will return for labs, visit, and her for cycle of chemotherapy on 06/26/2016.

## 2016-06-20 NOTE — Assessment & Plan Note (Signed)
Patient states that she had one episode of bright red rectal bleeding earlier today which had a bowel movement.  Patient states she has known chronic external hemorrhoids; but did not believe that the bleeding came from an external hemorrhoids today.  She is unaware of any internal hemorrhoids.  Blood counts obtained today were stable.  Patient was advised to call/return or go directly to the emergency department for any worsening rectal bleeding whatsoever.

## 2016-06-20 NOTE — Telephone Encounter (Signed)
L/M in home VM stating the information noted below by Dr. Marko Plume. Requested that Ms Teresa Norris call the [facility] Monday 06-23-16 with an update on rectal bleeding/ hemorrhoids. She is to go to the ER if she experiences a large amout of rectal bleeding prior to 06-23-16.

## 2016-06-23 ENCOUNTER — Other Ambulatory Visit: Payer: Self-pay | Admitting: Oncology

## 2016-06-23 NOTE — Telephone Encounter (Signed)
Gordy Levan, MD  Baruch Merl, RN        Please let patient know that decadron does not cause bleeding from hemorrhoids. Decadron is a different type of medicine from ASA and does not affect bleeding or blood clotting.  She should take decadron as prescribed prior to first taxol, this delayed until 7-27.  Please continue all care for hemorrhoids, to keep these as good as possible - sitz bath on toilet (do not think she should try in and out of tub), tucks pads, prep H or other regularly. Would avoid ASA for now, as this will increase bleeding risk.   We will discuss further at MD visit 7-27.  thanks

## 2016-06-23 NOTE — Telephone Encounter (Signed)
Discussed information noted below by Dr. Marko Plume with Ms. Teresa Norris.  She will  Try the decadron one more time.  Pt is to see Dr. Marko Plume prior to treatment so she can discuss side effects with her then.

## 2016-06-23 NOTE — Telephone Encounter (Addendum)
Teresa Norris called back this am and stated that she was not bleeding from her hemorrhoids.  She was bleeding from taking the decadron. She does this with ASA as well.  Is there another medication she can take instead of the decadron or is she going to have the bleeding each week with the decadron.

## 2016-06-25 ENCOUNTER — Other Ambulatory Visit: Payer: Self-pay | Admitting: Oncology

## 2016-06-26 ENCOUNTER — Encounter: Payer: Self-pay | Admitting: Oncology

## 2016-06-26 ENCOUNTER — Ambulatory Visit (HOSPITAL_BASED_OUTPATIENT_CLINIC_OR_DEPARTMENT_OTHER): Payer: PPO | Admitting: Oncology

## 2016-06-26 ENCOUNTER — Ambulatory Visit (HOSPITAL_BASED_OUTPATIENT_CLINIC_OR_DEPARTMENT_OTHER): Payer: PPO

## 2016-06-26 VITALS — BP 156/86 | HR 106 | Temp 98.6°F | Resp 18 | Ht 65.0 in | Wt 168.6 lb

## 2016-06-26 VITALS — BP 141/68 | HR 78 | Temp 98.2°F | Resp 18

## 2016-06-26 DIAGNOSIS — C541 Malignant neoplasm of endometrium: Secondary | ICD-10-CM

## 2016-06-26 DIAGNOSIS — C775 Secondary and unspecified malignant neoplasm of intrapelvic lymph nodes: Secondary | ICD-10-CM

## 2016-06-26 DIAGNOSIS — Z5111 Encounter for antineoplastic chemotherapy: Secondary | ICD-10-CM | POA: Diagnosis not present

## 2016-06-26 LAB — COMPREHENSIVE METABOLIC PANEL
ALT: 24 U/L (ref 0–55)
ANION GAP: 12 meq/L — AB (ref 3–11)
AST: 19 U/L (ref 5–34)
Albumin: 3.9 g/dL (ref 3.5–5.0)
Alkaline Phosphatase: 90 U/L (ref 40–150)
BUN: 22.7 mg/dL (ref 7.0–26.0)
CHLORIDE: 106 meq/L (ref 98–109)
CO2: 20 meq/L — AB (ref 22–29)
Calcium: 9.2 mg/dL (ref 8.4–10.4)
Creatinine: 1.2 mg/dL — ABNORMAL HIGH (ref 0.6–1.1)
EGFR: 42 mL/min/{1.73_m2} — AB (ref >90)
Glucose: 191 mg/dl — ABNORMAL HIGH (ref 70–140)
POTASSIUM: 4.2 meq/L (ref 3.5–5.1)
Sodium: 138 mEq/L (ref 136–145)
Total Bilirubin: 0.42 mg/dL (ref 0.20–1.20)
Total Protein: 7.3 g/dL (ref 6.4–8.3)

## 2016-06-26 LAB — CBC WITH DIFFERENTIAL/PLATELET
BASO%: 0.2 % (ref 0.0–2.0)
BASOS ABS: 0 10*3/uL (ref 0.0–0.1)
EOS ABS: 0 10*3/uL (ref 0.0–0.5)
EOS%: 0.1 % (ref 0.0–7.0)
HCT: 47.9 % — ABNORMAL HIGH (ref 34.8–46.6)
HGB: 15.9 g/dL (ref 11.6–15.9)
LYMPH%: 6.9 % — AB (ref 14.0–49.7)
MCH: 29.5 pg (ref 25.1–34.0)
MCHC: 33.1 g/dL (ref 31.5–36.0)
MCV: 89.1 fL (ref 79.5–101.0)
MONO#: 0 10*3/uL — ABNORMAL LOW (ref 0.1–0.9)
MONO%: 0.5 % (ref 0.0–14.0)
NEUT#: 5.5 10*3/uL (ref 1.5–6.5)
NEUT%: 92.3 % — ABNORMAL HIGH (ref 38.4–76.8)
PLATELETS: 219 10*3/uL (ref 145–400)
RBC: 5.38 10*6/uL (ref 3.70–5.45)
RDW: 13.7 % (ref 11.2–14.5)
WBC: 6 10*3/uL (ref 3.9–10.3)
lymph#: 0.4 10*3/uL — ABNORMAL LOW (ref 0.9–3.3)

## 2016-06-26 MED ORDER — SODIUM CHLORIDE 0.9 % IV SOLN
80.0000 mg/m2 | Freq: Once | INTRAVENOUS | Status: AC
  Administered 2016-06-26: 150 mg via INTRAVENOUS
  Filled 2016-06-26: qty 25

## 2016-06-26 MED ORDER — SODIUM CHLORIDE 0.9 % IV SOLN
Freq: Once | INTRAVENOUS | Status: AC
  Administered 2016-06-26: 15:00:00 via INTRAVENOUS
  Filled 2016-06-26: qty 5

## 2016-06-26 MED ORDER — DIPHENHYDRAMINE HCL 50 MG/ML IJ SOLN
25.0000 mg | Freq: Once | INTRAMUSCULAR | Status: AC
  Administered 2016-06-26: 25 mg via INTRAVENOUS

## 2016-06-26 MED ORDER — DIPHENHYDRAMINE HCL 50 MG/ML IJ SOLN
INTRAMUSCULAR | Status: AC
  Filled 2016-06-26: qty 1

## 2016-06-26 MED ORDER — FAMOTIDINE IN NACL 20-0.9 MG/50ML-% IV SOLN
20.0000 mg | Freq: Once | INTRAVENOUS | Status: AC
  Administered 2016-06-26: 20 mg via INTRAVENOUS

## 2016-06-26 MED ORDER — SODIUM CHLORIDE 0.9 % IV SOLN
262.4000 mg | Freq: Once | INTRAVENOUS | Status: AC
  Administered 2016-06-26: 260 mg via INTRAVENOUS
  Filled 2016-06-26: qty 26

## 2016-06-26 MED ORDER — SODIUM CHLORIDE 0.9 % IV SOLN
Freq: Once | INTRAVENOUS | Status: AC
  Administered 2016-06-26: 15:00:00 via INTRAVENOUS
  Filled 2016-06-26: qty 4

## 2016-06-26 MED ORDER — SODIUM CHLORIDE 0.9 % IV SOLN
Freq: Once | INTRAVENOUS | Status: AC
  Administered 2016-06-26: 14:00:00 via INTRAVENOUS

## 2016-06-26 MED ORDER — FAMOTIDINE IN NACL 20-0.9 MG/50ML-% IV SOLN
INTRAVENOUS | Status: AC
  Filled 2016-06-26: qty 50

## 2016-06-26 NOTE — Progress Notes (Signed)
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 Day 1 cycle 1 was delayed from last week when patient reported HA with visible bruising on side of head, and reported BRBPR. Plan chemo RT in sandwich fashion, consult with Dr Sondra Come scheduled 07-02-16. If we stay on schedule with first 3 cycles of chemo now, RT will be due to begin after day 15 cycle 3 due 08-21-16.  Daughter brought her to office and is now assisting patient's husband.   CT head without contrast on 06-19-16 had no subdural hematoma or other acute problems.   Patient had no further rectal bleeding after single episode last week. She has no discomfort perirectal area now, is not using sitz baths tho uses soaking compresses at times. She has no other bleeding. She now recalls that she "hit head in bathroom" causing the bruise on head last week; she did not recall that incident at time of evaluation last week. She has no HA now. She denies abdominal or pelvic discomfort related to surgery. Energy seems at baseline and she is eating well, including ice cream with premed decadron at 0230 today. She denies SOB, bladder symptoms. Reports she is anxious re husband's memory problems "I just pace the floor", but is glad that they have assistance at living facility and she is enjoying socialization there.  Remainder of 10 point Review of Systems negative  ONCOLOGIC HISTORY Patient presented with 2 episodes of vaginal bleeding. She was seen by Dr Simona Huh, with US showing unremarkable uterus and ovaries; endometrial biopsy found adenocarcinoma. She was referred to Dr Josephina Shih on 05-02-16, with robotic assisted total hysterectomy, BSO, SLN biopsy and right pelvic  lymphadnectomy by Dr Denman George on 05/06/16. Intraoperative findings were of 12cm fibroid uterus, intramural fibroids, no suspicious extrauterine disease, failed mapping on right side   Pathology ([id number])  revealed a 6cm grade 2 tumor that was 100% invaded through the myometrium (4.3 of 4.3cm) with LVSI present. The adnexa and cervix were negative for tumor, however there were 3 pelvic lymph nodes positive for macrometastatic disease (>57m).  Patient's post operative course was uncomplicated, DC home on POD 1, to skilled care at her living facility for first couple of days. CT CAP 05-22-16 showed no apparent metastatic disease, with possible 7 mm mucoid impaction in RLL lung. She had follow up visit with Dr RDenman Georgeon 05-30-16, with recommendation for adjuvant therapy with carboplatin taxol given in sandwich fashion with external beam radiation + vaginal brachytherapy. Cycle 1 was delayed a week due to head trauma and bleeding hemorrhoids, day 1 cycle 1 dose dense carbo taxol begun 06-26-16    Objective:  Vital signs in last 24 hours:  BP (!) 156/86 (BP Location: Right Arm, Patient Position: Sitting)   Pulse (!) 106   Temp 98.6 F (37 C) (Oral)   Resp 18   Ht 5' 5"  (1.651 m)   Wt 168 lb 9.6 oz (76.5 kg)   SpO2 98%   BMI 28.06 kg/m  Weight down 1 lb Alert, oriented and appropriate, talkative, fair historian. Ambulatory, chronic problems related to polio. Looks comfortable.  Initially refused to let MD look at hemorrhoids, but agreed to this after discussion.   HEENT:PERRL, sclerae not icteric. Oral  mucosa moist without lesions, posterior pharynx clear. No bruising or tenderness on head now.  Neck supple. No JVD.  Lymphatics:no cervical,supraclavicular or inguinal adenopathy Resp: clear to auscultation bilaterally and normal percussion bilaterally Cardio: regular rate and rhythm. No gallop. GI: soft, nontender, not distended, no mass or organomegaly. Normally active bowel sounds. Surgical  incisions not remarkable. Perirectal with small, noninflamed, nontender external hemorrhoidal tags Musculoskeletal/ Extremities: LE without pitting edema, cords, tenderness. Asymmetry LE due to polio in childhood. Neuro: speech fluent, occasional difficulty with word finding in regular conversation, otherwise nonfocal. PSYCH appropriate mood and affect Skin without rash, ecchymosis, petechiae. No ecchymosis on side of head.    Lab Results:  Results for orders placed or performed in visit on 06/26/16  CBC with Differential  Result Value Ref Range   WBC 6.0 3.9 - 10.3 10e3/uL   NEUT# 5.5 1.5 - 6.5 10e3/uL   HGB 15.9 11.6 - 15.9 g/dL   HCT 47.9 (H) 34.8 - 46.6 %   Platelets 219 145 - 400 10e3/uL   MCV 89.1 79.5 - 101.0 fL   MCH 29.5 25.1 - 34.0 pg   MCHC 33.1 31.5 - 36.0 g/dL   RBC 5.38 3.70 - 5.45 10e6/uL   RDW 13.7 11.2 - 14.5 %   lymph# 0.4 (L) 0.9 - 3.3 10e3/uL   MONO# 0.0 (L) 0.1 - 0.9 10e3/uL   Eosinophils Absolute 0.0 0.0 - 0.5 10e3/uL   Basophils Absolute 0.0 0.0 - 0.1 10e3/uL   NEUT% 92.3 (H) 38.4 - 76.8 %   LYMPH% 6.9 (L) 14.0 - 49.7 %   MONO% 0.5 0.0 - 14.0 %   EOS% 0.1 0.0 - 7.0 %   BASO% 0.2 0.0 - 2.0 %  Comprehensive metabolic panel  Result Value Ref Range   Sodium 138 136 - 145 mEq/L   Potassium 4.2 3.5 - 5.1 mEq/L   Chloride 106 98 - 109 mEq/L   CO2 20 (L) 22 - 29 mEq/L   Glucose 191 (H) 70 - 140 mg/dl   BUN 22.7 7.0 - 26.0 mg/dL   Creatinine 1.2 (H) 0.6 - 1.1 mg/dL   Total Bilirubin 0.42 0.20 - 1.20 mg/dL   Alkaline Phosphatase 90 40 - 150 U/L   AST 19 5 - 34 U/L   ALT 24 0 - 55 U/L   Total Protein 7.3 6.4 - 8.3 g/dL   Albumin 3.9 3.5 - 5.0 g/dL   Calcium 9.2 8.4 - 10.4 mg/dL   Anion Gap 12 (H) 3 - 11 mEq/L   EGFR 42 (L) >90 ml/min/1.73 m2     Studies/Results:  EXAM: CT HEAD WITHOUT CONTRAST 06-19-16  COMPARISON:  MRI of the brain dated 02/10/2015.  FINDINGS: Brain: No evidence of large acute infarct, mass effect, intracranial hemorrhage, or  ventriculomegaly. Hypodensity in the right periatrial white matter corresponding to prior MRI FLAIR signal abnormality likely represents minimal chronic microvascular ischemic changes.  Vascular: No hyperdense vessel or unexpected calcification.  Skull: Negative for fracture or focal lesion.  Sinuses/Orbits: Bilateral intra-ocular lens replacement. The visualized paranasal sinuses and mastoids are normally aerated.  Other: No gross soft tissue hematoma of the scalp.  IMPRESSION: 1. No acute intracranial abnormality. Minimal chronic microvascular ischemic changes. 2. No scalp mass or hematoma is identified.   Medications: I have reviewed the patient's current medications. If she has no problems with taxol allergic reaction, will decrease subsequent oral decadron to 20 mg 12 hrs prior; message to collaborative RNs for dosing refills  DISCUSSION Patient tells  me that she subsequently recalled hitting side of head in bathroom without LOC, which seems to have been cause of facial bruising last week.   Encouraged po fluids Encouraged good ongoing care for hemorrhoids. Suggested sitz bath set up on toilet, which she does not have. Patient understands that she should not try to get in and out of tub for sitz baths.     Assessment/Plan:  1.IIIC1 grade 2 endometrioid endometrial carcinoma in 82 yo lady: post robotic assisted hysterectomy BSO left sentinel and right pelvic lymphadnectomy 05-06-16, Plan for adjuvant chemotherapy given in sandwich fashion with radiation, 3 cycles of chemo prior to pelvic radiation then 3 cycles after. Day 1 cycle 1 delayed until 06-26-16 to evaluate concerns when she arrived for chemo on 06-19-16, stable to proceed now. Will give full dose decadron premeds with first taxol, then decrease to 12 hour prior dose only if no problems with that infusion. I will see her back with labs coordinating with treatments as possible.  Consultation with Dr Sondra Come 07-02-16 2.polio  age 74 affecting LLE 3.Facial trauma last week with HA such that chemo was not started then, urgent CT head negative, now recalls hitting head in BR causing that problem. Resolved.  4.Hemorrhoids: external hemorrhoids not remarkable or symptomatic now. I have encouraged her to manage this problem proactively while on chemo. post thyroid ablation remotely, on replacement by Dr Felipa Eth 5.post cataract extractions with lens implants 6.advance directives in place, copy requested for this EMR 7.osteopenia per EMR 8.likely some memory problems, fortunately is now in living facility with assistance. Family very attentive. Cc this note Dr Felipa Eth (includes report CT head above)  All questions answered and patient is in agreement with chemotherapy today as planned, knows to call if any concerns prior to next scheduled visit. Chemo orders confirmed. Time spent 25 min including >50% counseling and coordination of care. Cc Dr Felipa Eth  Evlyn Clines, MD   06/26/2016, 1:50 PM

## 2016-06-26 NOTE — Patient Instructions (Signed)
[facility] Discharge Instructions for Patients Receiving Chemotherapy  Today you received the following chemotherapy agents: Taxol and Carboplatin.      To help prevent nausea and vomiting after your treatment, we encourage you to take your nausea medication: Zofran. Take one every 8 hours as needed. You may also take Compazine (may make you drowsy) every 6 hours as needed for nausea not relieved by Zofran.   If you develop nausea and vomiting that is not controlled by your nausea medication, call the clinic.   BELOW ARE SYMPTOMS THAT SHOULD BE REPORTED IMMEDIATELY:  *FEVER GREATER THAN 100.5 F  *CHILLS WITH OR WITHOUT FEVER  NAUSEA AND VOMITING THAT IS NOT CONTROLLED WITH YOUR NAUSEA MEDICATION  *UNUSUAL SHORTNESS OF BREATH  *UNUSUAL BRUISING OR BLEEDING  TENDERNESS IN MOUTH AND THROAT WITH OR WITHOUT PRESENCE OF ULCERS  *URINARY PROBLEMS  *BOWEL PROBLEMS  UNUSUAL RASH Items with * indicate a potential emergency and should be followed up as soon as possible.  Feel free to call the clinic should you have any questions or concerns. The clinic phone number is [phone].  Please show the [facility] at check-in to the Emergency Department and triage nurse.

## 2016-06-27 ENCOUNTER — Telehealth: Payer: Self-pay

## 2016-06-27 DIAGNOSIS — C541 Malignant neoplasm of endometrium: Secondary | ICD-10-CM

## 2016-06-27 MED ORDER — DEXAMETHASONE 4 MG PO TABS: ORAL_TABLET | ORAL | 0 refills | Status: DC

## 2016-06-27 NOTE — Telephone Encounter (Signed)
-----   Message from Gordy Levan, MD sent at 06/26/2016  2:03 PM EDT ----- Please be sure she has decadron available for next 2 weeks, as she took premeds 7-20 with Rx then held.   As long as no infusion reaction to first taxol on 7-27, decadron will be 20 mg = five tabs 12 hrs prior (no 6 hr prior dose).  RN please review the different dosing with patient as long as she does ok on 7-27 thanks

## 2016-06-27 NOTE — Progress Notes (Signed)
GYN Location of Tumor / Histology: IIIC1 grade 2 endometrioid endometrial carcinoma   Teresa Norris presented with symptoms of: 2 episodes of vaginal bleeding  Biopsies revealed:   05/06/16  Diagnosis 1. Lymph node, sentinel, biopsy, left external iliac - ONE LYMPH NODE POSITIVE FOR METASTATIC ADENOCARCINOMA (1/1). 2. Lymph nodes, regional resection, right pelvic - TWO OF SEVEN LYMPH NODES POSITIVE FOR METASTATIC ADENOCARCINOMA (2/7). 3. Uterus +/- tubes/ovaries, neoplastic, and cervix - INVASIVE MODERATELY DIFFERENTIATED (FIGO GRADE 2) ENDOMETRIOID ADENOCARCINOMA. - TUMOR INVADES THROUGH ENTIRE MYOMETRIUM AND INVOLVES SEROSAL SURFACE. - SEE ONCOLOGY TEMPLATE. ADDITIONAL FINDINGS: - BENIGN BILATERAL OVARIES. - BENIGN BILATERAL FALLOPIAN TUBES WITH BENIGN PARATUBAL CYST FORMATION ON THE LEFT. - MYOMETRIUM DEMONSTRATES A CALCIFIED HYALINIZED LEIOMYOMA.  04/10/16 Diagnosis Endometrium, biopsy - ADENOCARCINOMA. - SEE COMMENT.  Past/Anticipated interventions by Gyn/Onc surgery, if any: 05/06/16 -Procedure: XI ROBOTIC ASSISTED TOTAL HYSTERECTOMY WITH BILATERAL SALPINGO OOPHORECTOMY;  Surgeon: Everitt Amber, MD   Past/Anticipated interventions by medical oncology, if any: adjuvant chemotherapy given in sandwich fashion with radiation, 3 cycles of chemo prior to pelvic radiation then 3 cycles after. Plan to begin using dose dense regimen on 06-19-16   Weight changes, if any: no  Bowel/Bladder complaints, if any: has had constipation.  Reports she had a good bowel movement yesterday and today.  She denies having any bladder issues.  Nausea/Vomiting, if any: no  Pain issues, if any:  no  SAFETY ISSUES:  Prior radiation? Yes - keloid on abdomen 70 years ago in [place] also had thyroid treatment when she was 45 at [facility].  Pacemaker/ICD? no  Possible current pregnancy? no  Is the patient on methotrexate? no  Current Complaints / other details:  Patient is here with her friend.  BP  (!) 142/81 (BP Location: Left Arm, Patient Position: Sitting, Cuff Size: Normal)   Pulse 82   Temp 98 F (36.7 C) (Oral)   Resp 18   Ht 5\' 6"  (1.676 m)   Wt 166 lb 14.4 oz (75.7 kg)   BMI 26.94 kg/m    Wt Readings from Last 3 Encounters:  07/02/16 166 lb 14.4 oz (75.7 kg)  06/30/16 168 lb 14.4 oz (76.6 kg)  06/29/16 166 lb (75.3 kg)

## 2016-06-27 NOTE — Telephone Encounter (Signed)
Spoke with Teresa Norris and she is doing very well after her treatment.  She has been shopping and went out to lunch. Told her that  for subsequent treatments she will only need to take 5 tablets of the Decadron 12 hours prior to chemo.  Will send in new prescriptionto her pharmacy.  Ms Morcom knows to call the [facility] if she has any issues or concerns at [phone].

## 2016-06-28 ENCOUNTER — Encounter: Payer: Self-pay | Admitting: Oncology

## 2016-06-29 ENCOUNTER — Encounter (HOSPITAL_COMMUNITY): Payer: Self-pay | Admitting: Emergency Medicine

## 2016-06-29 ENCOUNTER — Emergency Department (HOSPITAL_COMMUNITY)
Admission: EM | Admit: 2016-06-29 | Discharge: 2016-06-29 | Disposition: A | Discharge status: Home / Self Care | Payer: PPO | Attending: Emergency Medicine | Admitting: Emergency Medicine

## 2016-06-29 DIAGNOSIS — E039 Hypothyroidism, unspecified: Secondary | ICD-10-CM | POA: Insufficient documentation

## 2016-06-29 DIAGNOSIS — C541 Malignant neoplasm of endometrium: Secondary | ICD-10-CM | POA: Insufficient documentation

## 2016-06-29 DIAGNOSIS — K5900 Constipation, unspecified: Secondary | ICD-10-CM | POA: Diagnosis not present

## 2016-06-29 DIAGNOSIS — Z79899 Other long term (current) drug therapy: Secondary | ICD-10-CM | POA: Diagnosis not present

## 2016-06-29 DIAGNOSIS — S5011XA Contusion of right forearm, initial encounter: Secondary | ICD-10-CM | POA: Diagnosis not present

## 2016-06-29 HISTORY — DX: Hypothyroidism, unspecified: E03.9

## 2016-06-29 NOTE — ED Triage Notes (Signed)
Pt states she had her first round of chemotherapy on Thursday and has not had a bowel movement since  Pt states she has endometrial cancer and had a total hysterectomy  Pt states she has lymph node involvement  Pt states she has been taking colace and has used a suppository and today took some miralax  Pt states now she has a rash on her right arm

## 2016-06-29 NOTE — ED Notes (Addendum)
Pt reports constipation since Thursday after beginning chemotherapy treatment. Pt reports taking Colace and suppository along with drinking appx 64oz of fluid.

## 2016-06-29 NOTE — Discharge Instructions (Signed)
As discussed, today's evaluation has been largely reassuring her Please continue to use the [facility] lax, daily, as instructed. If this does not improve your condition, please add Senokot. This is available over-the-counter, at [facility] or [facility]. Please be sure to follow up with your oncologist and primary care physician.

## 2016-06-29 NOTE — ED Notes (Signed)
Awaiting discharge paperwork by physician.

## 2016-06-29 NOTE — ED Provider Notes (Signed)
[facility] DEPT Provider Note   CSN: [id number] Arrival date & time: 06/29/16  1908  First Provider Contact:  First MD Initiated Contact with Patient 06/29/16 2130        History   Chief Complaint Chief Complaint  Patient presents with  . Constipation    HPI Teresa Norris is a 82 y.o. female.  Patient presents to the ED with a chief complaint of constipation.  She states that she just started chemotherapy for endometrial cancer.  She states that she has not had a BM in the past 3 days.  She denies any pain.  She states that she called the cancer line and was told to come to the ED.  She states that she has tried taking colace with no relief.  She is passing gas.  She states that she does not have an urge to have a BM.    Additionally, she complains of a mild contusion/rash to her right forearm.  Denies any pain or known injury.  There are no modifying factors.     The history is provided by the patient. No language interpreter was used.    Past Medical History:  Diagnosis Date  . Anginal pain (HCC)    was not cardiac related  . Anxiety   . Arthritis   . Cancer [facility])    endometrial cancer  . Cataract    Both eyes  . Complication of anesthesia    states that anesthesia makes her groggy for about six weeks after surgery.  Hx. of polio   . Costochondritis   . Hypothyroid   . Muscle fatigue    Post polio  . Parasomnia    Night terrors  . Polio    At age 49 or 5 after a tonsillectomy    Patient Active Problem List   Diagnosis Date Noted  . Bright red rectal bleeding 06/20/2016  . Ecchymosis 06/20/2016  . Personal history of poliomyelitis 06/08/2016  . Hypothyroidism (acquired) 06/08/2016  . 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 Endometrial cancer ([place]) 05/06/2016  . 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 Osteopenia 03/23/2013  . Fibroid uterus 03/23/2013    Past Surgical History:  Procedure Laterality Date  . ABDOMINAL  HYSTERECTOMY    . BREAST SURGERY     BENIGN NODES  . CATARACT EXTRACTION    . EYE SURGERY Bilateral Lt 11/12   Rt 12/12   CATARACT  . 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 FOOT SURGERY  1938  . LEG SURGERY Left 1988  . LYMPH NODE BIOPSY N/A 05/06/2016   Procedure: Sentinel LYMPH NODE BIOPSY right total lymphandenapathy;  Surgeon: Everitt Amber, MD;  Location: [facility] ORS;  Service: Gynecology;  Laterality: N/A;  . ROBOTIC ASSISTED TOTAL HYSTERECTOMY WITH BILATERAL SALPINGO OOPHERECTOMY N/A 05/06/2016   Procedure: XI ROBOTIC ASSISTED TOTAL HYSTERECTOMY WITH BILATERAL SALPINGO OOPHORECTOMY;  Surgeon: Everitt Amber, MD;  Location: [facility] ORS;  Service: Gynecology;  Laterality: N/A;  . TONSILLECTOMY      OB History    Gravida Para Term Preterm AB Living   4 3     1 3    SAB TAB Ectopic Multiple Live Births   1               Home Medications    Prior to Admission medications   Medication Sig Start Date End Date Taking? Authorizing Provider  acetaminophen (TYLENOL) 500 MG tablet Take 500-1,000 mg by mouth every  6 (six) hours as needed for mild pain.    Historical Provider, MD  clonazePAM (KLONOPIN) 0.5 MG tablet TAKE 1 TABLET BY MOUTH AT NIGHT    Carmen Dohmeier, MD  dexamethasone (DECADRON) 4 MG tablet Take 5 tablets  with food 12 hrs prior to Taxol Chemotherapy. 06/27/16   Lennis Marion Downer, MD  docusate sodium (COLACE) 100 MG capsule Take 1 capsule (100 mg total) by mouth 2 (two) times daily. 05/07/16   Everitt Amber, MD  levothyroxine (SYNTHROID, LEVOTHROID) 100 MCG tablet Take 100 mcg by mouth daily before breakfast.    Historical Provider, MD  ondansetron (ZOFRAN) 8 MG tablet Take 1 tablet (8 mg total) by mouth every 8 (eight) hours as needed for nausea. Will not make drowsy. 06/10/16   Lennis Marion Downer, MD  polyvinyl alcohol (LIQUIFILM TEARS) 1.4 % ophthalmic solution Place 2 drops into both eyes daily.     Historical Provider, MD  prochlorperazine (COMPAZINE) 10 MG tablet Take 1 tablet (10 mg  total) by mouth every 6 (six) hours as needed for nausea. May make drowsy. 06/10/16   Lennis Marion Downer, MD    Family History Family History  Problem Relation Age of Onset  . Parkinson's disease Father   . Cancer Brother   . Acute myelogenous leukemia      Social History Social History  Substance Use Topics  . Smoking status: Never Smoker  . Smokeless tobacco: Never Used  . Alcohol use 2.0 - 2.5 oz/week    4 - 5 Standard drinks or equivalent per week     Comment: WINE      Allergies   Aspirin; Advil [ibuprofen]; and E-mycin [erythromycin]   Review of Systems Review of Systems  All other systems reviewed and are negative.    Physical Exam Updated Vital Signs BP 147/88 (BP Location: Right Arm)   Pulse 91   Temp 98.1 F (36.7 C) (Oral)   Resp 18   Ht 5\' 6"  (1.676 m)   Wt 75.3 kg   SpO2 93%   BMI 26.79 kg/m   Physical Exam  Constitutional: She is oriented to person, place, and time. She appears well-developed and well-nourished.  HENT:  Head: Normocephalic and atraumatic.  Eyes: Conjunctivae and EOM are normal. Pupils are equal, round, and reactive to light.  Neck: Normal range of motion. Neck supple.  Cardiovascular: Normal rate and regular rhythm.  Exam reveals no gallop and no friction rub.   No murmur heard. Pulmonary/Chest: Effort normal and breath sounds normal. No respiratory distress. She has no wheezes. She has no rales. She exhibits no tenderness.  Abdominal: Soft. Bowel sounds are normal. She exhibits no distension and no mass. There is no tenderness. There is no rebound and no guarding.  No focal abdominal tenderness, no RLQ tenderness or pain at McBurney's point, no RUQ tenderness or Murphy's sign, no left-sided abdominal tenderness, no fluid wave, or signs of peritonitis   Musculoskeletal: Normal range of motion. She exhibits no edema or tenderness.  Neurological: She is alert and oriented to person, place, and time.  Skin: Skin is warm and dry.    Mild contusion to right forearm, possibly phlebitis, but thought to be less likely, no warmth, no pain, no sign of cellulitis  Psychiatric: She has a normal mood and affect. Her behavior is normal. Judgment and thought content normal.  Nursing note and vitals reviewed.    ED Treatments / Results   Procedures Procedures (including critical care time)  Medications Ordered in  ED Medications - No data to display   Initial Impression / Assessment and Plan / ED Course  I have reviewed the triage vital signs and the nursing notes.  Pertinent labs & imaging results that were available during my care of the patient were reviewed by me and considered in my medical decision making (see chart for details).  Clinical Course    Patient with constipation x 3 days.  Passing gas.  No pain.  No vomiting.  VSS.  Told to be checked out by ED by Cancer call line.    No further emergent workup today.  Patient seen by and discussed with Dr. Vanita Panda, who agrees with the plan.  DC to home.  Final Clinical Impressions(s) / ED Diagnoses   Final diagnoses:  Constipation, unspecified constipation type    New Prescriptions Discharge Medication List as of 06/29/2016 10:48 PM       Montine Circle, PA-C 06/29/16 [place], MD 06/30/16 [phone]

## 2016-06-29 NOTE — ED Notes (Signed)
No respiratory or acute distress noted alert and oriented x 3 cal light in reach.

## 2016-06-29 NOTE — ED Notes (Signed)
PA at bedside.

## 2016-06-30 ENCOUNTER — Encounter: Payer: Self-pay | Admitting: Radiation Oncology

## 2016-06-30 ENCOUNTER — Telehealth: Payer: Self-pay

## 2016-06-30 ENCOUNTER — Ambulatory Visit (HOSPITAL_BASED_OUTPATIENT_CLINIC_OR_DEPARTMENT_OTHER): Payer: PPO | Admitting: Oncology

## 2016-06-30 ENCOUNTER — Other Ambulatory Visit: Payer: Self-pay | Admitting: Oncology

## 2016-06-30 ENCOUNTER — Other Ambulatory Visit (HOSPITAL_BASED_OUTPATIENT_CLINIC_OR_DEPARTMENT_OTHER): Payer: PPO

## 2016-06-30 ENCOUNTER — Encounter: Payer: Self-pay | Admitting: Oncology

## 2016-06-30 VITALS — BP 140/82 | HR 85 | Temp 98.1°F | Resp 18 | Ht 66.0 in | Wt 168.9 lb

## 2016-06-30 DIAGNOSIS — C541 Malignant neoplasm of endometrium: Secondary | ICD-10-CM

## 2016-06-30 DIAGNOSIS — R58 Hemorrhage, not elsewhere classified: Secondary | ICD-10-CM

## 2016-06-30 DIAGNOSIS — K5909 Other constipation: Secondary | ICD-10-CM | POA: Diagnosis not present

## 2016-06-30 LAB — CBC WITH DIFFERENTIAL/PLATELET
BASO%: 0.2 % (ref 0.0–2.0)
Basophils Absolute: 0 10*3/uL (ref 0.0–0.1)
EOS%: 2.7 % (ref 0.0–7.0)
Eosinophils Absolute: 0.2 10*3/uL (ref 0.0–0.5)
HEMATOCRIT: 47.7 % — AB (ref 34.8–46.6)
HGB: 15.8 g/dL (ref 11.6–15.9)
LYMPH#: 1 10*3/uL (ref 0.9–3.3)
LYMPH%: 15.7 % (ref 14.0–49.7)
MCH: 29.7 pg (ref 25.1–34.0)
MCHC: 33.1 g/dL (ref 31.5–36.0)
MCV: 89.7 fL (ref 79.5–101.0)
MONO#: 0.1 10*3/uL (ref 0.1–0.9)
MONO%: 1.4 % (ref 0.0–14.0)
NEUT%: 80 % — ABNORMAL HIGH (ref 38.4–76.8)
NEUTROS ABS: 5.2 10*3/uL (ref 1.5–6.5)
PLATELETS: 189 10*3/uL (ref 145–400)
RBC: 5.32 10*6/uL (ref 3.70–5.45)
RDW: 13.8 % (ref 11.2–14.5)
WBC: 6.5 10*3/uL (ref 3.9–10.3)

## 2016-06-30 LAB — COMPREHENSIVE METABOLIC PANEL
ALT: 59 U/L — AB (ref 0–55)
ANION GAP: 8 meq/L (ref 3–11)
AST: 15 U/L (ref 5–34)
Albumin: 3.8 g/dL (ref 3.5–5.0)
Alkaline Phosphatase: 77 U/L (ref 40–150)
BILIRUBIN TOTAL: 1.02 mg/dL (ref 0.20–1.20)
BUN: 23.7 mg/dL (ref 7.0–26.0)
CO2: 29 meq/L (ref 22–29)
CREATININE: 1 mg/dL (ref 0.6–1.1)
Calcium: 9.8 mg/dL (ref 8.4–10.4)
Chloride: 99 mEq/L (ref 98–109)
EGFR: 53 mL/min/{1.73_m2} — ABNORMAL LOW (ref >90)
GLUCOSE: 141 mg/dL — AB (ref 70–140)
Potassium: 4.7 mEq/L (ref 3.5–5.1)
Sodium: 136 mEq/L (ref 136–145)
TOTAL PROTEIN: 6.8 g/dL (ref 6.4–8.3)

## 2016-06-30 NOTE — Progress Notes (Signed)
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  Patient was also seen in ED on 7-30 with these complaints, no acute findings then.  Patient reports last bowel movement was day prior to chemo, on 06-25-16. When bowels had not moved by 7-28, she took 2 doses of colace, then colace x3 and miralax x1 on 7-29, then miralax x1 and glycerin suppository on 7-30. She took 2 senokot this AM and repeated glycerin suppository. None of this has resulted in bowel movement. She continues to eat, including a bagel and salad today, and is drinking fluids well. She has no abdominal pain, no N/V, no fever. Hemorrhoids are not bothering her presently, including no further bleeding. She noticed redness at level of antecubital lateral RUE ~ 2 days ago. This is not painful, not hot, not swollen, not getting worse, may be slightly better. IV access for first chemo was in lower right forearm, access on first attempt, no tenderness there.   Otherwise patient has done well with first chemotherapy, with no nausea. She is fatigued after hours in ED last pm, but not tired otherwise this week. She is voiding easily. No SOB. Remainder of 10 point Review of Systems negative/ unchanged.     No central catheter No genetics testing  ONCOLOGIC HISTORY Patient presented with 2 episodes of vaginal bleeding. She was seen by Dr Simona Huh, with US showing unremarkable uterus and ovaries; endometrial biopsy found adenocarcinoma. She was referred to Dr Josephina Shih on 05-02-16, with robotic assisted total hysterectomy, BSO, SLN biopsy and right pelvic lymphadnectomy by Dr Denman George on 05/06/16.  Intraoperative findings were of 12cm fibroid uterus, intramural fibroids, no suspicious extrauterine disease, failed mapping on right side Pathology ([id number]) revealed a 6cm grade 2 tumor that was 100% invaded through the myometrium (4.3 of 4.3cm) with LVSI present. The adnexa and cervix were negative for tumor, however there were 3 pelvic lymph nodes positive for macrometastatic disease (>59m).  Patient's post operative course was uncomplicated, DC home on POD 1, to skilled care at her living facility for first couple of days. CT CAP 05-22-16 showed no apparent metastatic disease, with possible 7 mm mucoid impaction in RLL lung. She had follow up visit with Dr RDenman Georgeon 05-30-16, with recommendation for adjuvant therapy with carboplatin taxol given in sandwich fashion with external beam radiation + vaginal brachytherapy. Cycle 1 was delayed a week due to head trauma and bleeding hemorrhoids, day 1 cycle 1 dose dense carbo taxol begun 06-26-16  Objective:  Vital signs in last 24 hours:  BP 140/82 (BP Location: Right Arm, Patient Position: Sitting)   Pulse 85   Temp 98.1 F (36.7 C) (Oral)   Resp 18   Ht 5' 6"  (1.676 m)   Wt 168 lb 14.4 oz (76.6 kg)   SpO2 97%   BMI 27.26 kg/m  Weight stable. Talkative, looks comfortable, cooperative Alert, oriented and appropriate. Ambulatory without assistance, adjustments for polio-related  LE asymmetry. Able to get on and off exam table with assistance of 1. No alopecia  HEENT:PERRL, sclerae not icteric. Oral mucosa moist without lesions, posterior pharynx clear.  Neck supple. No JVD.  Lymphatics:no cervical,supraclavicular adenopathy  Resp: clear to auscultation bilaterally and normal percussion bilaterally Cardio: regular rate and rhythm. No gallop. GI: soft, nontender, not distended, no mass or organomegaly. Normal bowel sounds. Surgical incisions not remarkable. Musculoskeletal/ Extremities: without pitting edema, cords, tenderness Neuro: no  peripheral neuropathy. Otherwise nonfocal  PSYCH appropriate mood and affect Skin   Laterally above and below right antecubital 2 rounded areas of erythema each ~ 4 cm, looks more like SQ bleeding than rash, not tender, not hot, no cords associated or in forearm, no streaking. Skin thin all of UE. Otherwise no ecchymoses, rash, petechiae.  Lab Results:  Results for orders placed or performed in visit on 06/30/16  CBC with Differential  Result Value Ref Range   WBC 6.5 3.9 - 10.3 10e3/uL   NEUT# 5.2 1.5 - 6.5 10e3/uL   HGB 15.8 11.6 - 15.9 g/dL   HCT 47.7 (H) 34.8 - 46.6 %   Platelets 189 145 - 400 10e3/uL   MCV 89.7 79.5 - 101.0 fL   MCH 29.7 25.1 - 34.0 pg   MCHC 33.1 31.5 - 36.0 g/dL   RBC 5.32 3.70 - 5.45 10e6/uL   RDW 13.8 11.2 - 14.5 %   lymph# 1.0 0.9 - 3.3 10e3/uL   MONO# 0.1 0.1 - 0.9 10e3/uL   Eosinophils Absolute 0.2 0.0 - 0.5 10e3/uL   Basophils Absolute 0.0 0.0 - 0.1 10e3/uL   NEUT% 80.0 (H) 38.4 - 76.8 %   LYMPH% 15.7 14.0 - 49.7 %   MONO% 1.4 0.0 - 14.0 %   EOS% 2.7 0.0 - 7.0 %   BASO% 0.2 0.0 - 2.0 %  Comprehensive metabolic panel  Result Value Ref Range   Sodium 136 136 - 145 mEq/L   Potassium 4.7 3.5 - 5.1 mEq/L   Chloride 99 98 - 109 mEq/L   CO2 29 22 - 29 mEq/L   Glucose 141 (H) 70 - 140 mg/dl   BUN 23.7 7.0 - 26.0 mg/dL   Creatinine 1.0 0.6 - 1.1 mg/dL   Total Bilirubin 1.02 0.20 - 1.20 mg/dL   Alkaline Phosphatase 77 40 - 150 U/L   AST 15 5 - 34 U/L   ALT 59 (H) 0 - 55 U/L   Total Protein 6.8 6.4 - 8.3 g/dL   Albumin 3.8 3.5 - 5.0 g/dL   Calcium 9.8 8.4 - 10.4 mg/dL   Anion Gap 8 3 - 11 mEq/L   EGFR 53 (L) >90 ml/min/1.73 m2  Labs reviewed with patient at time of visit.  Studies/Results:  No results found.  Medications: I have reviewed the patient's current medications.  DISCUSSION Agree with ED findings nothing otherwise obviously worrisome with this constipation. Patient will increase bowel program as below and let us know how she is  tomorrow. She will continue to stay well hydrated.   Erythema around antecubital does not appear typical for chemo infiltration or phelbitis/ cellulitis. This seems to correspond to area of tourniquet used for IV start, which patient recalls was extremely tight and uncomfortable. I expect this will resolve on its own; ok to use warm soaks if she wants. Expect nursing will use LUE for next IV.    Written and oral instructions as follows: For constipation:  Take one capful of miralax and 2 colace when you get home today Repeat one capful of miralax again 2 hours later if bowels have not moved. Use suppository 2 hours after second miralax if bowels have not moved.  Use Fleets enema this evening if bowels still  have not moved.  If no results after Fleets, take 2 senokot + 2 colace at bedtime tonight.  Call Dr Mariana Kaufman RN tomorrow morning to let us know how you are   [phone]   For red area on arm: Warm soaks with washcloth in very warm water several times this afternoon ok Let us know if tender, swelling, or worse otherwise.   OK to use CMET from today for taxol on 8-3, will recheck CBC that day (day 8 from first [place])  Assessment/Plan: 1.IIIC1 grade 2 endometrioid endometrial carcinoma in 82 yo lady: post robotic assisted hysterectomy BSO left sentinel and right pelvic lymphadnectomy 05-06-16, Plan for adjuvant chemotherapy given in sandwich fashion with radiation, 3 cycles of chemo prior to pelvic radiation then 3 cycles after. Day 1 cycle 1 given 06-26-16, tolerated well other than present constipation. Increase bowel program and follow up, likely will need to increase laxatives around each chemo treatment from here.  Consultation with Dr Sondra Come 07-02-16 2.bruising/ SQ bleeding RUE likely related to tourniquet used for IV start. Plan as above, reassured. 3.Facial trauma last week with HA such that chemo was not started then, urgent CT head negative, now recalls hitting head in [place] causing  that problem. Resolved.  4.Hemorrhoids: external hemorrhoids not remarkable or symptomatic now. I have encouraged her to manage this problem proactively while on chemo. post thyroid ablation remotely, on replacement by Dr Felipa Eth 5.post cataract extractions with lens implants 6.advance directives in place, copy requested for this EMR 7.osteopenia per EMR 8. is now in living facility with assistance. Family very attentive.  9.polio age 63 affecting LLE   All questions answered and patient is in agreement with recommendations and plans. She will let us know how she is on 8-1. Chemo and lab orders confirmed/ corrected. Time spent 25 min including >50% counseling and coordination of care.   Evlyn Clines, MD   06/30/2016, 8:34 PM

## 2016-06-30 NOTE — Telephone Encounter (Signed)
Ms. Verser has taken senokot  Today  A capful of Miralax yesterday.  Passing gas.  PCP told her to take a fleet Enema. She has a rash on right arm she noticed yesterday.  It is ~1/2 dollar size above and below the bend of the arm.  There is a little green color. around the edge.  Area not sore or itchy.  Pt denied falling or hitting anything. IV chemotherapy in right forearm ~06-26-16. Pt will come in to see Dr. Marko Plume today at 2 pm. Pt verbalized understanding.

## 2016-06-30 NOTE — Patient Instructions (Addendum)
For constipation:  Take one capful of miralax and 2 colace when you get home today Repeat one capful of miralax again 2 hours later if bowels have not moved. Use suppository 2 hours after second miralax if bowels have not moved.  Use Fleets enema this evening if bowels still have not moved.  If no results after Fleets, take 2 senokot + 2 colace at bedtime tonight.  Call Dr Mariana Kaufman RN tomorrow morning to let us know how you are   [phone]   For red area on arm: Warm soaks with washcloth in very warm water several times this afternoon ok Let us know if tender, swelling, or worse otherwise.

## 2016-07-01 ENCOUNTER — Telehealth: Payer: Self-pay

## 2016-07-01 DIAGNOSIS — K5900 Constipation, unspecified: Secondary | ICD-10-CM | POA: Insufficient documentation

## 2016-07-01 NOTE — Telephone Encounter (Signed)
Ms. Dlugos  Stated that she has had one small stool this am after taking the laxitives as noted below by Dr. Marko Plume.  Pt with some bleeding from her hemorrhoids. She has medications available for this at home. Told her that Dr. Marko Plume wants her to take a 1/2 bottle of Magnesium citrate over ice.  If she does not have a good evacuation of stool in 3 hours, she is to take the other half of the bottle.  Ms. Dierker verbalized understanding.

## 2016-07-01 NOTE — Telephone Encounter (Signed)
Patient Instructions Encounter Date: 06/30/2016 Gordy Levan, MD  Oncology    For constipation:  Take one capful of miralax and 2 colace when you get home today Repeat one capful of miralax again 2 hours later if bowels have not moved. Use suppository 2 hours after second miralax if bowels have not moved.  Use Fleets enema this evening if bowels still have not moved.  If no results after Fleets, take 2 senokot + 2 colace at bedtime tonight.  Call Dr Mariana Kaufman RN tomorrow morning to let us know how you are   [phone]   For red area on arm: Warm soaks with washcloth in very warm water several times this afternoon ok Let us know if tender, swelling, or worse otherwise.         Office Visit on 06/30/2016        Revision History        Detailed Report       Note shared with patient

## 2016-07-01 NOTE — Progress Notes (Signed)
ENCOUNTER OPENED IN ERROR

## 2016-07-02 ENCOUNTER — Ambulatory Visit
Admission: RE | Admit: 2016-07-02 | Discharge: 2016-07-02 | Disposition: A | Discharge status: Home / Self Care | Payer: PPO | Source: Ambulatory Visit | Attending: Radiation Oncology | Admitting: Radiation Oncology

## 2016-07-02 ENCOUNTER — Encounter: Payer: Self-pay | Admitting: Radiation Oncology

## 2016-07-02 VITALS — BP 142/81 | HR 82 | Temp 98.0°F | Resp 18 | Ht 66.0 in | Wt 166.9 lb

## 2016-07-02 DIAGNOSIS — C541 Malignant neoplasm of endometrium: Secondary | ICD-10-CM | POA: Insufficient documentation

## 2016-07-02 DIAGNOSIS — Z90722 Acquired absence of ovaries, bilateral: Secondary | ICD-10-CM | POA: Insufficient documentation

## 2016-07-02 DIAGNOSIS — Z923 Personal history of irradiation: Secondary | ICD-10-CM | POA: Diagnosis not present

## 2016-07-02 DIAGNOSIS — Z886 Allergy status to analgesic agent status: Secondary | ICD-10-CM | POA: Insufficient documentation

## 2016-07-02 DIAGNOSIS — Z90710 Acquired absence of both cervix and uterus: Secondary | ICD-10-CM | POA: Insufficient documentation

## 2016-07-02 DIAGNOSIS — C775 Secondary and unspecified malignant neoplasm of intrapelvic lymph nodes: Secondary | ICD-10-CM | POA: Insufficient documentation

## 2016-07-02 DIAGNOSIS — M1990 Unspecified osteoarthritis, unspecified site: Secondary | ICD-10-CM | POA: Insufficient documentation

## 2016-07-02 DIAGNOSIS — Z881 Allergy status to other antibiotic agents status: Secondary | ICD-10-CM | POA: Insufficient documentation

## 2016-07-02 DIAGNOSIS — F419 Anxiety disorder, unspecified: Secondary | ICD-10-CM | POA: Insufficient documentation

## 2016-07-02 DIAGNOSIS — Z806 Family history of leukemia: Secondary | ICD-10-CM | POA: Insufficient documentation

## 2016-07-02 DIAGNOSIS — Z79899 Other long term (current) drug therapy: Secondary | ICD-10-CM | POA: Diagnosis not present

## 2016-07-02 DIAGNOSIS — Z510 Encounter for antineoplastic radiation therapy: Secondary | ICD-10-CM | POA: Insufficient documentation

## 2016-07-02 DIAGNOSIS — E039 Hypothyroidism, unspecified: Secondary | ICD-10-CM | POA: Insufficient documentation

## 2016-07-02 NOTE — Telephone Encounter (Signed)
Spoke with Teresa Norris while waiting to see Dr. Sondra Come. She has had several good BM's since taking the 1/2 bottle of magnesium of citrate yesterday.  Stool more soft and loose behind "plug".

## 2016-07-02 NOTE — Progress Notes (Signed)
Please see the Nurse Progress Note in the MD Initial Consult Encounter for this patient. 

## 2016-07-02 NOTE — Progress Notes (Signed)
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  HISTORY OF PRESENT ILLNESS::Teresa Norris is a 82 y.o. female who presents today for initial consultation. She had 2 episodes of vaginal bleeding recently. She was seen by Dr. Simona Huh and had an ultrasound that showed the uterus and ovaries were unremarkable. Endometrial biopsy 04/10/2016 found adenocarcinoma. She was referred to Dr. Fermin Schwab 05/02/2016 and had a robotic assisted total hysterectomy, BSO, SLN biopsy and right pelvic lymphadnectomy by Dr. Denman George 05/06/2016. Pathology revealed a 6 cm grade 2 tumor that was 100% invaded through the myometrium with LVSI present. The adnexa and cervix were negative for tumor but there were 3/8 pelvic lymph nodes positive for macrometastatic disease.  She had a follow up with Dr. Denman George 05/30/2016 and she recommended adjuvant therapy with carboplatin taxol sandwiched with external beam radiation plus vaginal brachytherapy. She is here to discuss radiation treatment options.  On review of systems, she denies any weight changes.She mentions she has had constipation. She reports she had a bowel movement yesterday and today. She denies having any bladder issues. She has redness on her right upper arm after chemotherapy. She mentions she has been drinking Gatorade to help with the constipation. She denies nausea or vomiting. She denies any pain. She mentions she has had some tenderness in her back but nothing new since the diagnosis.  PREVIOUS RADIATION THERAPY: Yes, she ha a keloid on her abdomen 70 years ago in [place] and also had thyroid treatment when she was 84 at [place].  PAST MEDICAL HISTORY:  has a past  medical history of Anginal pain ([place]); Anxiety; Arthritis; Cancer ([place]); Cataract; Complication of anesthesia; Costochondritis; Hypothyroid; Muscle fatigue; Parasomnia; and Polio.    PAST SURGICAL HISTORY: Past Surgical History:  Procedure Laterality Date  . ABDOMINAL HYSTERECTOMY    . BREAST SURGERY     BENIGN NODES  . CATARACT EXTRACTION    . EYE SURGERY Bilateral Lt 11/12   Rt 12/12   CATARACT  . 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 FOOT SURGERY  1938  . LEG SURGERY Left 1988  . LYMPH NODE BIOPSY N/A 05/06/2016   Procedure: Sentinel LYMPH NODE BIOPSY right total lymphandenapathy;  Surgeon: Everitt Amber, MD;  Location: [facility];  Service: Gynecology;  Laterality: N/A;  . ROBOTIC ASSISTED TOTAL HYSTERECTOMY WITH BILATERAL SALPINGO OOPHERECTOMY N/A 05/06/2016   Procedure: XI ROBOTIC ASSISTED TOTAL HYSTERECTOMY WITH BILATERAL SALPINGO OOPHORECTOMY;  Surgeon: Everitt Amber, MD;  Location: [facility];  Service: Gynecology;  Laterality: N/A;  . TONSILLECTOMY      FAMILY HISTORY: family history includes Acute myelogenous leukemia in her brother; Parkinson's disease in her father.  SOCIAL HISTORY:  reports that she has never smoked. She has never used smokeless tobacco. She reports that she drinks about 1.8 oz of alcohol per week . She reports that she does not use drugs.  ALLERGIES: Aspirin; Advil [ibuprofen]; and E-mycin [erythromycin]  MEDICATIONS:  Current Outpatient Prescriptions  Medication Sig Dispense Refill  . acetaminophen (TYLENOL) 500 MG tablet Take 500-1,000 mg by mouth every 6 (six) hours as needed for mild pain.    . clonazePAM (KLONOPIN) 0.5 MG tablet TAKE 1 TABLET BY MOUTH AT  NIGHT 90 tablet 0  . dexamethasone (DECADRON) 4 MG tablet Take 5 tablets  with food 12 hrs prior to Taxol Chemotherapy. 25 tablet 0  . docusate sodium (COLACE) 100 MG capsule Take 1 capsule (100 mg total) by mouth 2 (two) times daily. 30 capsule 0  . levothyroxine (SYNTHROID, LEVOTHROID) 100 MCG  tablet Take 100 mcg by mouth daily before breakfast.    . ondansetron (ZOFRAN) 8 MG tablet Take 1 tablet (8 mg total) by mouth every 8 (eight) hours as needed for nausea. Will not make drowsy. 30 tablet 1  . polyethylene glycol (MIRALAX / GLYCOLAX) packet Take 17 g by mouth 2 (two) times daily as needed.    . polyvinyl alcohol (LIQUIFILM TEARS) 1.4 % ophthalmic solution Place 2 drops into both eyes daily.     . prochlorperazine (COMPAZINE) 10 MG tablet Take 1 tablet (10 mg total) by mouth every 6 (six) hours as needed for nausea. May make drowsy. 30 tablet 0   No current facility-administered medications for this encounter.     REVIEW OF SYSTEMS:  A 15 point review of systems is documented in the electronic medical record. This was obtained by the nursing staff. However, I reviewed this with the patient to discuss relevant findings and make appropriate changes.  Pertinent items are noted in HPI.   PHYSICAL EXAM:  height is 5\' 6"  (1.676 m) and weight is 166 lb 14.4 oz (75.7 kg). Her oral temperature is 98 F (36.7 C). Her blood pressure is 142/81 (abnormal) and her pulse is 82. Her respiration is 18.    General: Alert and oriented, in no acute distress HEENT: Head is normocephalic. Extraocular movements are intact. Oropharynx is clear. Neck: Neck is supple, no palpable cervical or supraclavicular lymphadenopathy. Heart: Regular in rate and rhythm with no murmurs, rubs, or gallops. Chest: Clear to auscultation bilaterally, with no rhonchi, wheezes, or rales. Abdomen: Soft, nontender, nondistended, with no rigidity or guarding. She has 5 small scars on her abdomen from her laparoscopic surgery. All healed well without signs of drainage or infection. Extremities: No cyanosis or edema. Appears to have some bruising in her right proximal forearm. Lymphatics: see Neck Exam Skin: No concerning lesions. Musculoskeletal: symmetric strength and muscle tone throughout. Mildly atrophied left leg from  polio. Neurologic: Cranial nerves II through XII are grossly intact. No obvious focalities. Speech is fluent. Coordination is intact. Psychiatric: Judgment and insight are intact. Affect is appropriate.  Pelvic exam deferred until planning and simulation date.  ECOG = 1  LABORATORY DATA:  Lab Results  Component Value Date   WBC 6.5 06/30/2016   HGB 15.8 06/30/2016   HCT 47.7 (H) 06/30/2016   MCV 89.7 06/30/2016   PLT 189 06/30/2016   NEUTROABS 5.2 06/30/2016   Lab Results  Component Value Date   NA 136 06/30/2016   K 4.7 06/30/2016   CL 101 05/07/2016   CO2 29 06/30/2016   GLUCOSE 141 (H) 06/30/2016   CREATININE 1.0 06/30/2016   CALCIUM 9.8 06/30/2016      RADIOGRAPHY: Ct Head Wo Contrast  Result Date: 06/19/2016 CLINICAL DATA:  82 y/o F; history of uterine cancer 6/17. There are single of the left temporal area with headaches. EXAM: CT HEAD WITHOUT CONTRAST TECHNIQUE: Contiguous axial images were obtained from the base of the skull through the vertex without intravenous contrast. COMPARISON:  MRI of the brain dated 02/10/2015. FINDINGS: Brain: No evidence of large acute infarct, mass effect, intracranial hemorrhage, or ventriculomegaly. Hypodensity in the  right periatrial white matter corresponding to prior MRI FLAIR signal abnormality likely represents minimal chronic microvascular ischemic changes. Vascular: No hyperdense vessel or unexpected calcification. Skull: Negative for fracture or focal lesion. Sinuses/Orbits: Bilateral intra-ocular lens replacement. The visualized paranasal sinuses and mastoids are normally aerated. Other: No gross soft tissue hematoma of the scalp. IMPRESSION: 1. No acute intracranial abnormality. Minimal chronic microvascular ischemic changes. 2. No scalp mass or hematoma is identified. Electronically Signed   By: Kristine Garbe M.D.   On: 06/19/2016 14:29    IMPRESSION: Ms. Hailes is an 82 yo woman with Stage IIIC1 grade 2 endometrioid  endometrial carcinoma with LVI. She is a good candidate external beam radiotherapy directed at the pelvis and intracavitary brachytherapy treatments directed at the vaginal cuff.   PLAN: We discussed the role of radiation and its side effects. We discussed that she would have 3 cycles of chemotherapy, radiation plus vaginal brachytherapy, then 3 more cycles of chemotherapy. The patient will be seen after her third cycle of chemotherapy to plan her treatment.      ------------------------------------------------  Blair Promise, PhD, MD    This document serves as a record of services personally performed by Gery Pray, MD. It was created on his behalf by Lendon Collar, a trained medical scribe. The creation of this record is based on the scribe's personal observations and the provider's statements to them. This document has been checked and approved by the attending provider.

## 2016-07-03 ENCOUNTER — Other Ambulatory Visit: Payer: Self-pay | Admitting: Oncology

## 2016-07-03 ENCOUNTER — Ambulatory Visit (HOSPITAL_BASED_OUTPATIENT_CLINIC_OR_DEPARTMENT_OTHER): Payer: PPO

## 2016-07-03 ENCOUNTER — Other Ambulatory Visit: Payer: PPO

## 2016-07-03 VITALS — BP 141/87 | HR 89 | Temp 98.1°F | Resp 17

## 2016-07-03 DIAGNOSIS — Z5111 Encounter for antineoplastic chemotherapy: Secondary | ICD-10-CM | POA: Diagnosis not present

## 2016-07-03 DIAGNOSIS — C541 Malignant neoplasm of endometrium: Secondary | ICD-10-CM | POA: Diagnosis not present

## 2016-07-03 MED ORDER — FAMOTIDINE IN NACL 20-0.9 MG/50ML-% IV SOLN
20.0000 mg | Freq: Once | INTRAVENOUS | Status: AC
  Administered 2016-07-03: 20 mg via INTRAVENOUS

## 2016-07-03 MED ORDER — DIPHENHYDRAMINE HCL 50 MG/ML IJ SOLN
INTRAMUSCULAR | Status: AC
  Filled 2016-07-03: qty 1

## 2016-07-03 MED ORDER — SODIUM CHLORIDE 0.9 % IV SOLN
Freq: Once | INTRAVENOUS | Status: AC
  Administered 2016-07-03: 13:00:00 via INTRAVENOUS
  Filled 2016-07-03: qty 4

## 2016-07-03 MED ORDER — SODIUM CHLORIDE 0.9 % IV SOLN
Freq: Once | INTRAVENOUS | Status: AC
  Administered 2016-07-03: 12:00:00 via INTRAVENOUS

## 2016-07-03 MED ORDER — DIPHENHYDRAMINE HCL 50 MG/ML IJ SOLN
25.0000 mg | Freq: Once | INTRAMUSCULAR | Status: AC
  Administered 2016-07-03: 25 mg via INTRAVENOUS

## 2016-07-03 MED ORDER — SODIUM CHLORIDE 0.9 % IV SOLN
80.0000 mg/m2 | Freq: Once | INTRAVENOUS | Status: AC
  Administered 2016-07-03: 150 mg via INTRAVENOUS
  Filled 2016-07-03: qty 25

## 2016-07-03 MED ORDER — DEXAMETHASONE SODIUM PHOSPHATE 100 MG/10ML IJ SOLN: 20.0000 mg | Freq: Once | INTRAMUSCULAR | Status: DC

## 2016-07-03 MED ORDER — FAMOTIDINE IN NACL 20-0.9 MG/50ML-% IV SOLN
INTRAVENOUS | Status: AC
  Filled 2016-07-03: qty 50

## 2016-07-03 MED ORDER — SODIUM CHLORIDE 0.9 % IV SOLN: Freq: Once | INTRAVENOUS | Status: DC

## 2016-07-03 NOTE — Progress Notes (Signed)
Per Dr. Marko Plume ok to treat pt today with CBC from 06/30/2016.

## 2016-07-03 NOTE — Patient Instructions (Signed)
[facility] Discharge Instructions for Patients Receiving Chemotherapy  Today you received the following chemotherapy agents: Taxol       To help prevent nausea and vomiting after your treatment, we encourage you to take your nausea medication: Zofran. Take one every 8 hours as needed. You may also take Compazine (may make you drowsy) every 6 hours as needed for nausea not relieved by Zofran.   If you develop nausea and vomiting that is not controlled by your nausea medication, call the clinic.   BELOW ARE SYMPTOMS THAT SHOULD BE REPORTED IMMEDIATELY:  *FEVER GREATER THAN 100.5 F  *CHILLS WITH OR WITHOUT FEVER  NAUSEA AND VOMITING THAT IS NOT CONTROLLED WITH YOUR NAUSEA MEDICATION  *UNUSUAL SHORTNESS OF BREATH  *UNUSUAL BRUISING OR BLEEDING  TENDERNESS IN MOUTH AND THROAT WITH OR WITHOUT PRESENCE OF ULCERS  *URINARY PROBLEMS  *BOWEL PROBLEMS  UNUSUAL RASH Items with * indicate a potential emergency and should be followed up as soon as possible.  Feel free to call the clinic should you have any questions or concerns. The clinic phone number is [phone].  Please show the [facility] at check-in to the Emergency Department and triage nurse.

## 2016-07-08 ENCOUNTER — Other Ambulatory Visit: Payer: Self-pay | Admitting: Oncology

## 2016-07-10 ENCOUNTER — Ambulatory Visit (HOSPITAL_BASED_OUTPATIENT_CLINIC_OR_DEPARTMENT_OTHER): Payer: PPO | Admitting: Oncology

## 2016-07-10 ENCOUNTER — Encounter: Payer: Self-pay | Admitting: Oncology

## 2016-07-10 ENCOUNTER — Ambulatory Visit (HOSPITAL_BASED_OUTPATIENT_CLINIC_OR_DEPARTMENT_OTHER): Payer: PPO

## 2016-07-10 ENCOUNTER — Ambulatory Visit: Payer: PPO

## 2016-07-10 ENCOUNTER — Other Ambulatory Visit (HOSPITAL_BASED_OUTPATIENT_CLINIC_OR_DEPARTMENT_OTHER): Payer: PPO

## 2016-07-10 VITALS — BP 144/88 | HR 100 | Temp 98.2°F | Resp 18 | Ht 66.0 in | Wt 166.9 lb

## 2016-07-10 DIAGNOSIS — IMO0001 Reserved for inherently not codable concepts without codable children: Secondary | ICD-10-CM

## 2016-07-10 DIAGNOSIS — Z5111 Encounter for antineoplastic chemotherapy: Secondary | ICD-10-CM

## 2016-07-10 DIAGNOSIS — R040 Epistaxis: Secondary | ICD-10-CM | POA: Diagnosis not present

## 2016-07-10 DIAGNOSIS — R030 Elevated blood-pressure reading, without diagnosis of hypertension: Secondary | ICD-10-CM

## 2016-07-10 DIAGNOSIS — C541 Malignant neoplasm of endometrium: Secondary | ICD-10-CM | POA: Diagnosis not present

## 2016-07-10 LAB — CBC WITH DIFFERENTIAL/PLATELET
BASO%: 0 % (ref 0.0–2.0)
Basophils Absolute: 0 10*3/uL (ref 0.0–0.1)
EOS%: 0 % (ref 0.0–7.0)
Eosinophils Absolute: 0 10*3/uL (ref 0.0–0.5)
HCT: 42.4 % (ref 34.8–46.6)
HGB: 14.6 g/dL (ref 11.6–15.9)
LYMPH%: 13.7 % — AB (ref 14.0–49.7)
MCH: 30.2 pg (ref 25.1–34.0)
MCHC: 34.4 g/dL (ref 31.5–36.0)
MCV: 87.8 fL (ref 79.5–101.0)
MONO#: 0 10*3/uL — AB (ref 0.1–0.9)
MONO%: 1.1 % (ref 0.0–14.0)
NEUT%: 85.2 % — ABNORMAL HIGH (ref 38.4–76.8)
NEUTROS ABS: 1.5 10*3/uL (ref 1.5–6.5)
Platelets: 217 10*3/uL (ref 145–400)
RBC: 4.83 10*6/uL (ref 3.70–5.45)
RDW: 13.2 % (ref 11.2–14.5)
WBC: 1.8 10*3/uL — AB (ref 3.9–10.3)
lymph#: 0.2 10*3/uL — ABNORMAL LOW (ref 0.9–3.3)

## 2016-07-10 LAB — COMPREHENSIVE METABOLIC PANEL
ALT: 21 U/L (ref 0–55)
AST: 17 U/L (ref 5–34)
Albumin: 3.7 g/dL (ref 3.5–5.0)
Alkaline Phosphatase: 67 U/L (ref 40–150)
Anion Gap: 12 mEq/L — ABNORMAL HIGH (ref 3–11)
BILIRUBIN TOTAL: 0.33 mg/dL (ref 0.20–1.20)
BUN: 18.3 mg/dL (ref 7.0–26.0)
CHLORIDE: 102 meq/L (ref 98–109)
CO2: 22 meq/L (ref 22–29)
CREATININE: 0.9 mg/dL (ref 0.6–1.1)
Calcium: 9.7 mg/dL (ref 8.4–10.4)
EGFR: 58 mL/min/{1.73_m2} — ABNORMAL LOW (ref >90)
GLUCOSE: 225 mg/dL — AB (ref 70–140)
Potassium: 4.3 mEq/L (ref 3.5–5.1)
SODIUM: 135 meq/L — AB (ref 136–145)
TOTAL PROTEIN: 6.9 g/dL (ref 6.4–8.3)

## 2016-07-10 MED ORDER — SODIUM CHLORIDE 0.9 % IV SOLN
80.0000 mg/m2 | Freq: Once | INTRAVENOUS | Status: AC
  Administered 2016-07-10: 150 mg via INTRAVENOUS
  Filled 2016-07-10: qty 25

## 2016-07-10 MED ORDER — DIPHENHYDRAMINE HCL 50 MG/ML IJ SOLN
25.0000 mg | Freq: Once | INTRAMUSCULAR | Status: AC
  Administered 2016-07-10: 25 mg via INTRAVENOUS

## 2016-07-10 MED ORDER — FAMOTIDINE IN NACL 20-0.9 MG/50ML-% IV SOLN
INTRAVENOUS | Status: AC
  Filled 2016-07-10: qty 50

## 2016-07-10 MED ORDER — FAMOTIDINE IN NACL 20-0.9 MG/50ML-% IV SOLN
20.0000 mg | Freq: Once | INTRAVENOUS | Status: AC
  Administered 2016-07-10: 20 mg via INTRAVENOUS

## 2016-07-10 MED ORDER — DIPHENHYDRAMINE HCL 50 MG/ML IJ SOLN
INTRAMUSCULAR | Status: AC
  Filled 2016-07-10: qty 1

## 2016-07-10 MED ORDER — SODIUM CHLORIDE 0.9 % IV SOLN
Freq: Once | INTRAVENOUS | Status: AC
  Administered 2016-07-10: 15:00:00 via INTRAVENOUS
  Filled 2016-07-10: qty 4

## 2016-07-10 MED ORDER — SODIUM CHLORIDE 0.9 % IV SOLN
Freq: Once | INTRAVENOUS | Status: AC
  Administered 2016-07-10: 15:00:00 via INTRAVENOUS

## 2016-07-10 NOTE — Patient Instructions (Addendum)
[facility] Discharge Instructions for Patients Receiving Chemotherapy  Today you received the following chemotherapy agents Taxol   To help prevent nausea and vomiting after your treatment, we encourage you to take your nausea medication as directed.   If you develop nausea and vomiting that is not controlled by your nausea medication, call the clinic.   BELOW ARE SYMPTOMS THAT SHOULD BE REPORTED IMMEDIATELY:  *FEVER GREATER THAN 100.5 F  *CHILLS WITH OR WITHOUT FEVER  NAUSEA AND VOMITING THAT IS NOT CONTROLLED WITH YOUR NAUSEA MEDICATION  *UNUSUAL SHORTNESS OF BREATH  *UNUSUAL BRUISING OR BLEEDING  TENDERNESS IN MOUTH AND THROAT WITH OR WITHOUT PRESENCE OF ULCERS  *URINARY PROBLEMS  *BOWEL PROBLEMS  UNUSUAL RASH Items with * indicate a potential emergency and should be followed up as soon as possible.  Feel free to call the clinic you have any questions or concerns. The clinic phone number is [phone].  Please show the CHEMO ALERT CARD at check-in to the Emergency Department and triage nurse.   

## 2016-07-10 NOTE — Progress Notes (Signed)
OFFICE PROGRESS NOTE  July 10, 2016  Physicians:Emma Rossi,[place], Christiane Ha, MD, Thurnell Lose, Daneen Schick), Elsie Saas), Floyde Parkins)  INTERVAL HISTORY  Patient is seen, together with friend, in continuing attention to adjuvant dose dense carbo taxol in progress for IIIC1 grade 2 endometrioid carcinoma of endometrium. She is due day 15 cycle 1 today. She has not needed gCSF to date (granix approved).  Plan is for external beam radiation after first 3 cycles of chemo, then vaginal brachytherapy and additional 3 cycles.   Constipation resolved after multiple doses of laxatives, bowels not moving well with 2 Senokot daily. Hemorrhoids did not flare even with the constipation. The tourniquet bruise RUE has nearly resolved. She had left sided nosebleed this AM, resolved without intervention, no other bleeding: discussed management. Appetite at baseline. She has slept better since she has gone for short walks outdoors, which she enjoys. She denies abdominal or pelvic pain, SOB, fever or symptoms of infection. No peripheral neuropathy. BP higher here today than her usual and she will follow at home, no HA, no chest pain or palpitations, has taken steroid premed for chemo today.  No other falls reported.  Remainder of 10 point Review of Systems negative.   No central catheter No genetics testing. MMR testing normal/ no apparent MSI  ONCOLOGIC HISTORY Patient presented with 2 episodes of vaginal bleeding. She was seen by Dr Simona Huh, with US showing unremarkable uterus and ovaries; endometrial biopsy found adenocarcinoma. She was referred to Dr Josephina Shih on 05-02-16, with robotic assisted total hysterectomy, BSO, SLN biopsy and right pelvic lymphadnectomy by Dr Denman George on 05/06/16. Intraoperative findings were of 12cm fibroid uterus, intramural fibroids, no suspicious extrauterine disease, failed mapping on right side Pathology ([id number]) revealed a 6cm grade 2 tumor that was 100% invaded  through the myometrium (4.3 of 4.3cm) with LVSI present. The adnexa and cervix were negative for tumor, however there were 3 pelvic lymph nodes positive for macrometastatic disease (>7m). MMR testing normal. CT CAP 05-22-16 showed no apparent metastatic disease, with possible 7 mm mucoid impaction in RLL lung. Recommendation for adjuvant therapy with carboplatin taxol given in sandwich fashion with external beam radiation + vaginal brachytherapy. Cycle 1 was delayed a week due to head trauma and bleeding hemorrhoids, day 1 cycle 1 dose dense carbo taxol begun 06-26-16  Objective:  Vital signs in last 24 hours: Initial BP 159/92 with HR 110, repeated manually BP (!) 144/88 (BP Location: Left Arm, Patient Position: Sitting) Comment: manual BP  Pulse 100 Comment: apical pulse  Temp 98.2 F (36.8 C) (Oral)   Resp 18   Ht 5' 6"  (1.676 m)   Wt 166 lb 14.4 oz (75.7 kg)   SpO2 97%   BMI 26.94 kg/m  Weight down 2 lbs Alert, oriented and appropriate. Ambulatory without assistance.  Alopecia  HEENT:PERRL, sclerae not icteric. Oral mucosa moist without lesions, posterior pharynx clear. No obvious site for left epistaxis apparent, no bleeding now. Neck supple. No JVD.  Lymphatics:no supraclavicular adenopathy Resp: clear to auscultation bilaterally and normal percussion bilaterally Cardio: regular rate and rhythm. No gallop. GI: soft, nontender, not distended, no mass or organomegaly. Normally active bowel sounds. Surgical incisions not remarkable. Musculoskeletal/ Extremities: without pitting edema, cords, tenderness. LE asymmetry related to polio in childhood Neuro: no peripheral neuropathy. Otherwise nonfocal. PSYCH appropriate mood and affect Skin nearly resolved ecchymoses above and below antecubital corresponding to tourniquet, otherwise without rash, ecchymosis, petechiae   Lab Results:  Results for orders placed or performed in visit on  07/10/16  CBC with Differential  Result Value Ref  Range   WBC 1.8 (L) 3.9 - 10.3 10e3/uL   NEUT# 1.5 1.5 - 6.5 10e3/uL   HGB 14.6 11.6 - 15.9 g/dL   HCT 42.4 34.8 - 46.6 %   Platelets 217 145 - 400 10e3/uL   MCV 87.8 79.5 - 101.0 fL   MCH 30.2 25.1 - 34.0 pg   MCHC 34.4 31.5 - 36.0 g/dL   RBC 4.83 3.70 - 5.45 10e6/uL   RDW 13.2 11.2 - 14.5 %   lymph# 0.2 (L) 0.9 - 3.3 10e3/uL   MONO# 0.0 (L) 0.1 - 0.9 10e3/uL   Eosinophils Absolute 0.0 0.0 - 0.5 10e3/uL   Basophils Absolute 0.0 0.0 - 0.1 10e3/uL   NEUT% 85.2 (H) 38.4 - 76.8 %   LYMPH% 13.7 (L) 14.0 - 49.7 %   MONO% 1.1 0.0 - 14.0 %   EOS% 0.0 0.0 - 7.0 %   BASO% 0.0 0.0 - 2.0 %  Comprehensive metabolic panel  Result Value Ref Range   Sodium 135 (L) 136 - 145 mEq/L   Potassium 4.3 3.5 - 5.1 mEq/L   Chloride 102 98 - 109 mEq/L   CO2 22 22 - 29 mEq/L   Glucose 225 (H) 70 - 140 mg/dl   BUN 18.3 7.0 - 26.0 mg/dL   Creatinine 0.9 0.6 - 1.1 mg/dL   Total Bilirubin 0.33 0.20 - 1.20 mg/dL   Alkaline Phosphatase 67 40 - 150 U/L   AST 17 5 - 34 U/L   ALT 21 0 - 55 U/L   Total Protein 6.9 6.4 - 8.3 g/dL   Albumin 3.7 3.5 - 5.0 g/dL   Calcium 9.7 8.4 - 10.4 mg/dL   Anion Gap 12 (H) 3 - 11 mEq/L   EGFR 58 (L) >90 ml/min/1.73 m2    CBC reviewed at time of visit, platelet count fine and would not be cause of epistaxis,  and CMET prior to start of chemo. May need to add gCSF, but ok for treatment today  Studies/Results:  Addendum to Surgical Path FINAL for DONETA, BAYMAN ([id number]) Patient: YERALDI, FIDLER I Collected: 05/06/2016 Client: [facility] Accession: [id number] Received: 05/06/2016 Everitt Amber, MD REPORT OF SURGICAL PATHOLOGY ADDITIONAL INFORMATION: 3. Mismatch Repair (MMR) Protein Immunohistochemistry (IHC) IHC Expression Result: MLH1: Preserved nuclear expression (greater 50% tumor expression) MSH2: Preserved nuclear expression (greater 50% tumor expression) MSH6: Preserved nuclear expression (greater 50% tumor expression) PMS2: Preserved nuclear expression  (greater 50% tumor expression) * Internal control demonstrates intact nuclear expression Interpretation: NORMAL There is preserved expression of the major and minor MMR proteins. There is a very low probability that microsatellite instability (MSI) is present.   Medications: I have reviewed the patient's current medications. She may need to increase laxatives after chemo to keep bowels moving well .  DISCUSSION Interval history reviewed Meds as above Encouraged walking for exercise as tolerated. BP likely up with anticipation of blood draw and chemo, steroids. Some lower when rechecked now. She will have it checked over next few days at home Keep nares moist with increased steam or saline spray. Do not dislodge any clots.    Assessment/Plan:   1.IIIC1 grade 2 endometrioid endometrial carcinoma in 82 yo lady: post robotic assisted hysterectomy BSO left sentinel and right pelvic lymphadnectomy 05-06-16, Planfor adjuvant chemotherapy given in sandwich fashion with radiation, 3 cycles of chemo prior to pelvic radiation then 3 cycles after. Day 1 cycle 1 given 06-26-16, for day 15 cycle  1 today. 2 Epistaxis earlier today, no bleeding now. Previous bruise on face after fall in [place] and bruising/ SQ bleeding RUE likely related to tourniquet used for IV start, resolved. Does not have ASA listed on home meds, tho may need to ask specifically. Platelet count not low enough to contribute significantly. 3.Constipation after first treatment, resolved, and long history of hemorrhoids: encouraged her to manage this problem proactively while on chemo. 4.post thyroid ablation remotely, on replacement by Dr Felipa Eth 5.post cataract extractions with lens implants 6.advance directives in place, copy requested for this EMR 7.osteopenia per EMR 8. In living facility with husband and good assistance. Family very attentive.  9.polio age 73 affecting LLE   All questions answered and she knows to call if concerns  prior to next scheduled visit. Chemo orders confirmed. For cycle 2 wiill keep carbo dose at same AUC. Time spent 25 min including >50% counseling and coordination of care. Route Dr Felipa Eth    Evlyn Clines, MD 07-10-2016

## 2016-07-10 NOTE — Progress Notes (Signed)
Per Dr. Marko Plume approved to proceed with treatment with WBC 1.8.

## 2016-07-10 NOTE — Progress Notes (Signed)
Due to previous issue with PIV arm, flushed following chemo for additional time.

## 2016-07-11 ENCOUNTER — Telehealth: Payer: Self-pay

## 2016-07-11 NOTE — Telephone Encounter (Signed)
Teresa Norris called stating that Dr. Marko Plume told her to check her BP at home. She had a busy morning. Came home and rested and relaxed.  She took her BP and it was 168/91.  This was startling to her. What does Dr. Marko Plume want her to do?

## 2016-07-11 NOTE — Telephone Encounter (Signed)
  Teresa Norris I Female, 82 y.o., [birth year] Weight:  166 lb 14.4 oz (75.7 kg) Phone:  [phone] PCP:  Lajean Manes, MD [place] MRN:  [id number] MyChart:  Pending Next Appt:  07/17/2016 Message  Received: Today  Message Contents  Gordy Levan, MD  Baruch Merl, RN        She should check BP or have nurse check it this weekend, then call Dr Carlyle Lipa office next week to let him know the readings  thanks

## 2016-07-11 NOTE — Telephone Encounter (Signed)
Told Ms Arnson to check and document blood pressurse am and pm this weekend and call Dr. Carlyle Lipa office with readings.  Ms Knaub verbalized understanding.

## 2016-07-14 ENCOUNTER — Telehealth: Payer: Self-pay | Admitting: Oncology

## 2016-07-14 NOTE — Telephone Encounter (Signed)
appts made per ;os. Pt will get updated schedule on 8/17 visit

## 2016-07-17 ENCOUNTER — Ambulatory Visit (HOSPITAL_BASED_OUTPATIENT_CLINIC_OR_DEPARTMENT_OTHER): Payer: PPO

## 2016-07-17 ENCOUNTER — Other Ambulatory Visit (HOSPITAL_BASED_OUTPATIENT_CLINIC_OR_DEPARTMENT_OTHER): Payer: PPO

## 2016-07-17 VITALS — BP 154/81 | HR 68 | Temp 98.8°F | Resp 18

## 2016-07-17 DIAGNOSIS — Z5111 Encounter for antineoplastic chemotherapy: Secondary | ICD-10-CM

## 2016-07-17 DIAGNOSIS — C541 Malignant neoplasm of endometrium: Secondary | ICD-10-CM

## 2016-07-17 LAB — COMPREHENSIVE METABOLIC PANEL
ALBUMIN: 3.9 g/dL (ref 3.5–5.0)
ALT: 20 U/L (ref 0–55)
ANION GAP: 9 meq/L (ref 3–11)
AST: 15 U/L (ref 5–34)
Alkaline Phosphatase: 72 U/L (ref 40–150)
BILIRUBIN TOTAL: 0.35 mg/dL (ref 0.20–1.20)
BUN: 17.4 mg/dL (ref 7.0–26.0)
CO2: 22 meq/L (ref 22–29)
CREATININE: 0.8 mg/dL (ref 0.6–1.1)
Calcium: 10.1 mg/dL (ref 8.4–10.4)
Chloride: 105 mEq/L (ref 98–109)
EGFR: 70 mL/min/{1.73_m2} — ABNORMAL LOW (ref >90)
GLUCOSE: 120 mg/dL (ref 70–140)
Potassium: 4.5 mEq/L (ref 3.5–5.1)
Sodium: 136 mEq/L (ref 136–145)
TOTAL PROTEIN: 7.1 g/dL (ref 6.4–8.3)

## 2016-07-17 LAB — CBC WITH DIFFERENTIAL/PLATELET
BASO%: 0.2 % (ref 0.0–2.0)
Basophils Absolute: 0 10*3/uL (ref 0.0–0.1)
EOS%: 0.1 % (ref 0.0–7.0)
Eosinophils Absolute: 0 10*3/uL (ref 0.0–0.5)
HCT: 44.8 % (ref 34.8–46.6)
HEMOGLOBIN: 14.8 g/dL (ref 11.6–15.9)
LYMPH#: 0.4 10*3/uL — AB (ref 0.9–3.3)
LYMPH%: 11.4 % — AB (ref 14.0–49.7)
MCH: 29.5 pg (ref 25.1–34.0)
MCHC: 33 g/dL (ref 31.5–36.0)
MCV: 89.5 fL (ref 79.5–101.0)
MONO#: 0.1 10*3/uL (ref 0.1–0.9)
MONO%: 1.6 % (ref 0.0–14.0)
NEUT%: 86.7 % — ABNORMAL HIGH (ref 38.4–76.8)
NEUTROS ABS: 3.2 10*3/uL (ref 1.5–6.5)
PLATELETS: 264 10*3/uL (ref 145–400)
RBC: 5.01 10*6/uL (ref 3.70–5.45)
RDW: 13.8 % (ref 11.2–14.5)
WBC: 3.7 10*3/uL — AB (ref 3.9–10.3)

## 2016-07-17 LAB — TECHNOLOGIST REVIEW

## 2016-07-17 MED ORDER — SODIUM CHLORIDE 0.9 % IV SOLN
Freq: Once | INTRAVENOUS | Status: AC
  Administered 2016-07-17: 13:00:00 via INTRAVENOUS

## 2016-07-17 MED ORDER — SODIUM CHLORIDE 0.9 % IV SOLN
80.0000 mg/m2 | Freq: Once | INTRAVENOUS | Status: AC
  Administered 2016-07-17: 150 mg via INTRAVENOUS
  Filled 2016-07-17: qty 25

## 2016-07-17 MED ORDER — SODIUM CHLORIDE 0.9 % IV SOLN
Freq: Once | INTRAVENOUS | Status: AC
  Administered 2016-07-17: 13:00:00 via INTRAVENOUS
  Filled 2016-07-17: qty 4

## 2016-07-17 MED ORDER — FAMOTIDINE IN NACL 20-0.9 MG/50ML-% IV SOLN
INTRAVENOUS | Status: AC
  Filled 2016-07-17: qty 50

## 2016-07-17 MED ORDER — CARBOPLATIN CHEMO INJECTION 450 MG/45ML
294.8000 mg | Freq: Once | INTRAVENOUS | Status: AC
  Administered 2016-07-17: 290 mg via INTRAVENOUS
  Filled 2016-07-17: qty 29

## 2016-07-17 MED ORDER — SODIUM CHLORIDE 0.9 % IV SOLN
Freq: Once | INTRAVENOUS | Status: AC
  Administered 2016-07-17: 14:00:00 via INTRAVENOUS
  Filled 2016-07-17: qty 5

## 2016-07-17 MED ORDER — DIPHENHYDRAMINE HCL 50 MG/ML IJ SOLN
25.0000 mg | Freq: Once | INTRAMUSCULAR | Status: AC
  Administered 2016-07-17: 25 mg via INTRAVENOUS

## 2016-07-17 MED ORDER — DIPHENHYDRAMINE HCL 50 MG/ML IJ SOLN
INTRAMUSCULAR | Status: AC
Start: 2016-07-17 — End: 2016-07-17
  Filled 2016-07-17: qty 1

## 2016-07-17 MED ORDER — FAMOTIDINE IN NACL 20-0.9 MG/50ML-% IV SOLN
20.0000 mg | Freq: Once | INTRAVENOUS | Status: AC
  Administered 2016-07-17: 20 mg via INTRAVENOUS

## 2016-07-17 NOTE — Patient Instructions (Signed)
[facility] Discharge Instructions for Patients Receiving Chemotherapy  Today you received the following chemotherapy agents: Taxol and Carboplatin.      To help prevent nausea and vomiting after your treatment, we encourage you to take your nausea medication: Zofran. Take one every 8 hours as needed. You may also take Compazine (may make you drowsy) every 6 hours as needed for nausea not relieved by Zofran.   If you develop nausea and vomiting that is not controlled by your nausea medication, call the clinic.   BELOW ARE SYMPTOMS THAT SHOULD BE REPORTED IMMEDIATELY:  *FEVER GREATER THAN 100.5 F  *CHILLS WITH OR WITHOUT FEVER  NAUSEA AND VOMITING THAT IS NOT CONTROLLED WITH YOUR NAUSEA MEDICATION  *UNUSUAL SHORTNESS OF BREATH  *UNUSUAL BRUISING OR BLEEDING  TENDERNESS IN MOUTH AND THROAT WITH OR WITHOUT PRESENCE OF ULCERS  *URINARY PROBLEMS  *BOWEL PROBLEMS  UNUSUAL RASH Items with * indicate a potential emergency and should be followed up as soon as possible.  Feel free to call the clinic should you have any questions or concerns. The clinic phone number is [phone].  Please show the [place] at check-in to the Emergency Department and triage nurse.

## 2016-07-17 NOTE — Progress Notes (Unsigned)
1410- Patient complained of burning at the IV site. Vein appeared pink along it's contour. Flush IV site with NS and no signs of infiltration. Good blood return obtained. Repositioned tubing and secured. Patient was given a warm compress. Will continue to monitor.  1445- The pinkness at the IV site has decreased and the patient is no longer complaining of any pain, burning or itching.

## 2016-07-20 ENCOUNTER — Other Ambulatory Visit: Payer: Self-pay | Admitting: Oncology

## 2016-07-24 ENCOUNTER — Ambulatory Visit (HOSPITAL_BASED_OUTPATIENT_CLINIC_OR_DEPARTMENT_OTHER): Payer: PPO

## 2016-07-24 ENCOUNTER — Encounter: Payer: Self-pay | Admitting: Oncology

## 2016-07-24 ENCOUNTER — Ambulatory Visit (HOSPITAL_BASED_OUTPATIENT_CLINIC_OR_DEPARTMENT_OTHER): Payer: PPO | Admitting: Oncology

## 2016-07-24 ENCOUNTER — Other Ambulatory Visit (HOSPITAL_BASED_OUTPATIENT_CLINIC_OR_DEPARTMENT_OTHER): Payer: PPO

## 2016-07-24 ENCOUNTER — Ambulatory Visit: Payer: PPO

## 2016-07-24 VITALS — BP 166/90 | HR 110 | Temp 98.5°F | Resp 18 | Ht 66.0 in | Wt 165.6 lb

## 2016-07-24 DIAGNOSIS — K5909 Other constipation: Secondary | ICD-10-CM | POA: Diagnosis not present

## 2016-07-24 DIAGNOSIS — K649 Unspecified hemorrhoids: Secondary | ICD-10-CM | POA: Diagnosis not present

## 2016-07-24 DIAGNOSIS — Z5111 Encounter for antineoplastic chemotherapy: Secondary | ICD-10-CM | POA: Diagnosis not present

## 2016-07-24 DIAGNOSIS — C541 Malignant neoplasm of endometrium: Secondary | ICD-10-CM

## 2016-07-24 LAB — CBC WITH DIFFERENTIAL/PLATELET
BASO%: 0.2 % (ref 0.0–2.0)
BASOS ABS: 0 10*3/uL (ref 0.0–0.1)
EOS ABS: 0 10*3/uL (ref 0.0–0.5)
EOS%: 0.1 % (ref 0.0–7.0)
HEMATOCRIT: 43.8 % (ref 34.8–46.6)
HEMOGLOBIN: 14.5 g/dL (ref 11.6–15.9)
LYMPH%: 13 % — ABNORMAL LOW (ref 14.0–49.7)
MCH: 29.5 pg (ref 25.1–34.0)
MCHC: 33.1 g/dL (ref 31.5–36.0)
MCV: 89.2 fL (ref 79.5–101.0)
MONO#: 0.1 10*3/uL (ref 0.1–0.9)
MONO%: 1.9 % (ref 0.0–14.0)
NEUT#: 2.6 10*3/uL (ref 1.5–6.5)
NEUT%: 84.8 % — AB (ref 38.4–76.8)
Platelets: 208 10*3/uL (ref 145–400)
RBC: 4.91 10*6/uL (ref 3.70–5.45)
RDW: 14.1 % (ref 11.2–14.5)
WBC: 3.1 10*3/uL — ABNORMAL LOW (ref 3.9–10.3)
lymph#: 0.4 10*3/uL — ABNORMAL LOW (ref 0.9–3.3)

## 2016-07-24 LAB — COMPREHENSIVE METABOLIC PANEL
ALBUMIN: 3.8 g/dL (ref 3.5–5.0)
ALK PHOS: 74 U/L (ref 40–150)
ALT: 17 U/L (ref 0–55)
AST: 15 U/L (ref 5–34)
Anion Gap: 10 mEq/L (ref 3–11)
BUN: 22.7 mg/dL (ref 7.0–26.0)
CALCIUM: 9.8 mg/dL (ref 8.4–10.4)
CHLORIDE: 104 meq/L (ref 98–109)
CO2: 22 mEq/L (ref 22–29)
Creatinine: 0.8 mg/dL (ref 0.6–1.1)
EGFR: 63 mL/min/{1.73_m2} — AB (ref >90)
Glucose: 162 mg/dl — ABNORMAL HIGH (ref 70–140)
POTASSIUM: 4.6 meq/L (ref 3.5–5.1)
SODIUM: 136 meq/L (ref 136–145)
Total Bilirubin: 0.41 mg/dL (ref 0.20–1.20)
Total Protein: 7 g/dL (ref 6.4–8.3)

## 2016-07-24 MED ORDER — SODIUM CHLORIDE 0.9 % IV SOLN
80.0000 mg/m2 | Freq: Once | INTRAVENOUS | Status: AC
  Administered 2016-07-24: 150 mg via INTRAVENOUS
  Filled 2016-07-24: qty 25

## 2016-07-24 MED ORDER — FAMOTIDINE IN NACL 20-0.9 MG/50ML-% IV SOLN
INTRAVENOUS | Status: AC
  Filled 2016-07-24: qty 50

## 2016-07-24 MED ORDER — FAMOTIDINE IN NACL 20-0.9 MG/50ML-% IV SOLN
20.0000 mg | Freq: Once | INTRAVENOUS | Status: AC
  Administered 2016-07-24: 20 mg via INTRAVENOUS

## 2016-07-24 MED ORDER — HYDROCORTISONE 2.5 % RE CREA: 1.0000 "application " | TOPICAL_CREAM | Freq: Every day | RECTAL | 1 refills | Status: DC | PRN

## 2016-07-24 MED ORDER — DIPHENHYDRAMINE HCL 50 MG/ML IJ SOLN
INTRAMUSCULAR | Status: AC
  Filled 2016-07-24: qty 1

## 2016-07-24 MED ORDER — SODIUM CHLORIDE 0.9 % IV SOLN
Freq: Once | INTRAVENOUS | Status: AC
  Administered 2016-07-24: 11:00:00 via INTRAVENOUS

## 2016-07-24 MED ORDER — DIPHENHYDRAMINE HCL 50 MG/ML IJ SOLN
25.0000 mg | Freq: Once | INTRAMUSCULAR | Status: AC
  Administered 2016-07-24: 25 mg via INTRAVENOUS

## 2016-07-24 MED ORDER — DEXAMETHASONE SODIUM PHOSPHATE 100 MG/10ML IJ SOLN
Freq: Once | INTRAMUSCULAR | Status: AC
  Administered 2016-07-24: 11:00:00 via INTRAVENOUS
  Filled 2016-07-24: qty 4

## 2016-07-24 NOTE — Progress Notes (Signed)
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 She had day 1 cycle 2 on 07-17-16 and is for day 8 cycle 2 today. She has not needed gCSF thus far. Plan is for radiation in sandwich fashion after cycle 3, then additional 3 cycles of chemotherapy after radiation.   Patient is tearful during first of visit today, tells me that she had a bad week with fatigue, emotional , some bowel issues. BP cuff at home does not seem accurate and she plans to get that calibrated, is to check BP 2x daily for 2 weeks then to see Dr Felipa Eth. She was able to go about some regular activities, including with granddaughter (who has postural hypotension syndrome, is at [facility]) and activities with friends. She has been eating and drinking fluids, no significant nausea and no vomiting. Bowels have moved daily for several days, using 1 senokot and fruit salad. She has some blood "from rectum" when wipes, or at times slight bleeding without wiping, does not think that external hemorrhoids are the problem, is not using anything for hemorrhoids now. Some soreness to direct pressure in area of LUQ laparoscopy incision. No hematuria or dysuria. No increased SOB. She has anterior chest wall pain, chronic with exacerbations, which she describes as costochondritis, treats with heating pad. No LE swelling. No significant peripheral neuropathy. She is having trouble sleeping, given prescription for trazodone from PCP but did not take than night before chemo. No significant problems with peripheral IV access. No peripheral neuropathy. Only other bleeding is slight intermittent from nose, which feels dry despite saline nose spray. Remainder of 10 point  Review of Systems negative.    No central catheter No genetics testing. MMR testing normal/ no apparent MSI    ONCOLOGIC HISTORY Patient presented with 2 episodes of vaginal bleeding. She was seen by Dr Simona Huh, with US showing unremarkable uterus and ovaries; endometrial biopsy found adenocarcinoma. She was referred to Dr Josephina Shih on 05-02-16, with robotic assisted total hysterectomy, BSO, SLN biopsy and right pelvic lymphadnectomy by Dr Denman George on 05/06/16. Intraoperative findings were of 12cm fibroid uterus, intramural fibroids, no suspicious extrauterine disease, failed mapping on right side Pathology ([id number]) revealed a 6cm grade 2 tumor that was 100% invaded through the myometrium (4.3 of 4.3cm) with LVSI present. The adnexa and cervix were negative for tumor, however there were 3 pelvic lymph nodes positive for macrometastatic disease (>24m). MMR testing normal. CT CAP 05-22-16 showed no apparent metastatic disease, with possible 7 mm mucoid impaction in RLL lung. Recommendation for adjuvant therapy with carboplatin taxol given in sandwich fashion with external beam radiation + vaginal brachytherapy. Cycle 1 was delayed a week due to head trauma and bleeding hemorrhoids, day 1 cycle 1 dose dense carbo taxol begun 06-26-16   Objective:  Vital signs in last 24 hours:  BP (!) 166/90 (BP Location: Right Arm, Patient Position: Sitting)   Pulse (!) 110   Temp 98.5 F (36.9 C) (Oral)   Resp 18   Ht _0  (1.676 m)   Wt 165 lb 9.6 oz (75.1 kg)   SpO2 97%   BMI 26.73 kg/m  Weight down 1 lb Alert, oriented and appropriate. Ambulatory without assistance difficulty.  Alopecia  HEENT:PERRL, sclerae not icteric. Oral mucosa  moist without lesions, posterior pharynx clear.  Neck supple. No JVD.  Lymphatics:no cervical,supraclavicular or inguinal adenopathy Resp: clear to auscultation bilaterally and normal percussion bilaterally Cardio: regular rate and rhythm. No gallop. GI: abdomen  soft, slightly tender to deep palpation just below medial to LUQ laparoscopic incision, which is closed, no erythema.. No other tenderness, not distended, no mass or organomegaly. Normally active bowel sounds. Surgical incisions otherwise not remarkable. Musculoskeletal/ Extremities:LE without pitting edema, cords, tenderness. Tenderness across anterior chest including out laterally from costosternal junctions, no rash or obvious trauma Neuro: no peripheral neuropathy. Otherwise nonfocal. PSYCH slightly tearful initially, calm by completion of visit.  Skin without rash, ecchymosis, petechiae   Lab Results:  Results for orders placed or performed in visit on 07/24/16  CBC with Differential  Result Value Ref Range   WBC 3.1 (L) 3.9 - 10.3 10e3/uL   NEUT# 2.6 1.5 - 6.5 10e3/uL   HGB 14.5 11.6 - 15.9 g/dL   HCT 43.8 34.8 - 46.6 %   Platelets 208 145 - 400 10e3/uL   MCV 89.2 79.5 - 101.0 fL   MCH 29.5 25.1 - 34.0 pg   MCHC 33.1 31.5 - 36.0 g/dL   RBC 4.91 3.70 - 5.45 10e6/uL   RDW 14.1 11.2 - 14.5 %   lymph# 0.4 (L) 0.9 - 3.3 10e3/uL   MONO# 0.1 0.1 - 0.9 10e3/uL   Eosinophils Absolute 0.0 0.0 - 0.5 10e3/uL   Basophils Absolute 0.0 0.0 - 0.1 10e3/uL   NEUT% 84.8 (H) 38.4 - 76.8 %   LYMPH% 13.0 (L) 14.0 - 49.7 %   MONO% 1.9 0.0 - 14.0 %   EOS% 0.1 0.0 - 7.0 %   BASO% 0.2 0.0 - 2.0 %   CMET available prior to treatment normal with exception of glucose on steroids of 162 and EGFR 63, including K 4.6, creat 0.8, LFTs fine.  Studies/Results:  No results found.  Medications: I have reviewed the patient's current medications. Begin proctofoam HC or equivalent for internal hemorrhoids.   DISCUSSION  Interval history reviewed. Generally week after day 1 carbo taxol of each cycle has more chemo side effects, so hopefully this next week will be easier.  Discussed adjusting activities depending on tolerance. Discussed adjusting laxatives.   Update to radiation oncology that we expect to  complete cycle 3 ~ 08-21-16   Assessment/Plan:  1.IIIC1 grade 2 endometrioid endometrial carcinoma in 82 yo lady: post robotic assisted hysterectomy BSO left sentinel and right pelvic lymphadnectomy 05-06-16, Planfor adjuvant chemotherapy given in sandwich fashion with radiation, 3 cycles of chemo prior to pelvic radiation then 3 cycles after. Day 1 cycle 1 given7-27-17, for day 8 cycle 2 today. Plan radiation in sandwich fashion after cycle 3. I will see her with day 1 cycle 3 on 08-07-16 and on 9-18 prior to day 15 cycle 3 on 08-21-16.  2. Fatigue multifactorial with chemo, stress, difficulty sleeping, physical discomfort. Discussed.  3.Constipation after first treatment, resolved, and long history of hemorrhoids: encouraged her to manage this problem proactively while on chemo.Some BRB per rectum thought from internal hemorrhoids, will try anusol HC cream or suppository into rectum.  4.post thyroid ablation remotely, on replacement by Dr Felipa Eth 5.post cataract extractions with lens implants 6.advance directives in place, copy requested for this EMR 7.osteopenia per EMR 8. In living facility with husband and good assistance. Family very attentive.  9.polio age 57 affecting LLE  10. Occasional minimal epistaxis: encouraged her to continue using saline nose spray. Platelet count  fine. 11. BP variable, to check home cuff and has follow up with Dr Felipa Eth in 2 weeks. She prefers not to have this checked by nurse at living facility.  All questions answered and patient is in agreement with continuing treatment as planned, knows to call if concerns prior to next scheduled visit. Chemo orders confirmed. Time spent 25 min including >50% counseling and coordination of care. Cc Dr Felipa Eth., Dr Sondra Come  Evlyn Clines, MD   07/24/2016, 9:49 AM

## 2016-07-24 NOTE — Patient Instructions (Signed)
[facility] Discharge Instructions for Patients Receiving Chemotherapy  Today you received the following chemotherapy agents Taxol  To help prevent nausea and vomiting after your treatment, we encourage you to take your nausea medication   If you develop nausea and vomiting that is not controlled by your nausea medication, call the clinic.   BELOW ARE SYMPTOMS THAT SHOULD BE REPORTED IMMEDIATELY:  *FEVER GREATER THAN 100.5 F  *CHILLS WITH OR WITHOUT FEVER  NAUSEA AND VOMITING THAT IS NOT CONTROLLED WITH YOUR NAUSEA MEDICATION  *UNUSUAL SHORTNESS OF BREATH  *UNUSUAL BRUISING OR BLEEDING  TENDERNESS IN MOUTH AND THROAT WITH OR WITHOUT PRESENCE OF ULCERS  *URINARY PROBLEMS  *BOWEL PROBLEMS  UNUSUAL RASH Items with * indicate a potential emergency and should be followed up as soon as possible.  Feel free to call the clinic you have any questions or concerns. The clinic phone number is [phone].  Please show the CHEMO ALERT CARD at check-in to the Emergency Department and triage nurse.   

## 2016-07-26 DIAGNOSIS — K649 Unspecified hemorrhoids: Secondary | ICD-10-CM | POA: Insufficient documentation

## 2016-07-31 ENCOUNTER — Ambulatory Visit (HOSPITAL_BASED_OUTPATIENT_CLINIC_OR_DEPARTMENT_OTHER): Payer: PPO

## 2016-07-31 ENCOUNTER — Other Ambulatory Visit (HOSPITAL_BASED_OUTPATIENT_CLINIC_OR_DEPARTMENT_OTHER): Payer: PPO

## 2016-07-31 VITALS — BP 145/91 | HR 100 | Temp 98.3°F | Resp 17

## 2016-07-31 DIAGNOSIS — Z5111 Encounter for antineoplastic chemotherapy: Secondary | ICD-10-CM | POA: Diagnosis not present

## 2016-07-31 DIAGNOSIS — C541 Malignant neoplasm of endometrium: Secondary | ICD-10-CM

## 2016-07-31 LAB — CBC WITH DIFFERENTIAL/PLATELET
BASO%: 0.3 % (ref 0.0–2.0)
BASOS ABS: 0 10*3/uL (ref 0.0–0.1)
EOS%: 0 % (ref 0.0–7.0)
Eosinophils Absolute: 0 10*3/uL (ref 0.0–0.5)
HCT: 39.5 % (ref 34.8–46.6)
HGB: 13.6 g/dL (ref 11.6–15.9)
LYMPH%: 11 % — AB (ref 14.0–49.7)
MCH: 30.3 pg (ref 25.1–34.0)
MCHC: 34.4 g/dL (ref 31.5–36.0)
MCV: 88 fL (ref 79.5–101.0)
MONO#: 0.1 10*3/uL (ref 0.1–0.9)
MONO%: 2.6 % (ref 0.0–14.0)
NEUT%: 86.1 % — ABNORMAL HIGH (ref 38.4–76.8)
NEUTROS ABS: 3 10*3/uL (ref 1.5–6.5)
NRBC: 0 % (ref 0–0)
Platelets: 218 10*3/uL (ref 145–400)
RBC: 4.49 10*6/uL (ref 3.70–5.45)
RDW: 14.2 % (ref 11.2–14.5)
WBC: 3.5 10*3/uL — AB (ref 3.9–10.3)
lymph#: 0.4 10*3/uL — ABNORMAL LOW (ref 0.9–3.3)

## 2016-07-31 LAB — COMPREHENSIVE METABOLIC PANEL
ALT: 18 U/L (ref 0–55)
AST: 16 U/L (ref 5–34)
Albumin: 3.8 g/dL (ref 3.5–5.0)
Alkaline Phosphatase: 65 U/L (ref 40–150)
Anion Gap: 10 mEq/L (ref 3–11)
BUN: 23.7 mg/dL (ref 7.0–26.0)
CHLORIDE: 107 meq/L (ref 98–109)
CO2: 21 meq/L — AB (ref 22–29)
CREATININE: 0.9 mg/dL (ref 0.6–1.1)
Calcium: 9.3 mg/dL (ref 8.4–10.4)
EGFR: 59 mL/min/{1.73_m2} — ABNORMAL LOW (ref >90)
GLUCOSE: 119 mg/dL (ref 70–140)
Potassium: 4.3 mEq/L (ref 3.5–5.1)
Sodium: 138 mEq/L (ref 136–145)
TOTAL PROTEIN: 6.7 g/dL (ref 6.4–8.3)
Total Bilirubin: 0.31 mg/dL (ref 0.20–1.20)

## 2016-07-31 LAB — TECHNOLOGIST REVIEW

## 2016-07-31 MED ORDER — SODIUM CHLORIDE 0.9 % IV SOLN
Freq: Once | INTRAVENOUS | Status: AC
  Administered 2016-07-31: 14:00:00 via INTRAVENOUS
  Filled 2016-07-31: qty 4

## 2016-07-31 MED ORDER — SODIUM CHLORIDE 0.9 % IV SOLN
Freq: Once | INTRAVENOUS | Status: AC
  Administered 2016-07-31: 14:00:00 via INTRAVENOUS

## 2016-07-31 MED ORDER — DIPHENHYDRAMINE HCL 50 MG/ML IJ SOLN
25.0000 mg | Freq: Once | INTRAMUSCULAR | Status: AC
  Administered 2016-07-31: 25 mg via INTRAVENOUS

## 2016-07-31 MED ORDER — FAMOTIDINE IN NACL 20-0.9 MG/50ML-% IV SOLN
INTRAVENOUS | Status: AC
  Filled 2016-07-31: qty 50

## 2016-07-31 MED ORDER — DIPHENHYDRAMINE HCL 50 MG/ML IJ SOLN
INTRAMUSCULAR | Status: AC
  Filled 2016-07-31: qty 1

## 2016-07-31 MED ORDER — FAMOTIDINE IN NACL 20-0.9 MG/50ML-% IV SOLN
20.0000 mg | Freq: Once | INTRAVENOUS | Status: AC
  Administered 2016-07-31: 20 mg via INTRAVENOUS

## 2016-07-31 MED ORDER — PACLITAXEL CHEMO INJECTION 300 MG/50ML
80.0000 mg/m2 | Freq: Once | INTRAVENOUS | Status: AC
  Administered 2016-07-31: 150 mg via INTRAVENOUS
  Filled 2016-07-31: qty 25

## 2016-07-31 NOTE — Patient Instructions (Signed)
[facility] Discharge Instructions for Patients Receiving Chemotherapy  Today you received the following chemotherapy agents Taxol   To help prevent nausea and vomiting after your treatment, we encourage you to take your nausea medication as directed.   If you develop nausea and vomiting that is not controlled by your nausea medication, call the clinic.   BELOW ARE SYMPTOMS THAT SHOULD BE REPORTED IMMEDIATELY:  *FEVER GREATER THAN 100.5 F  *CHILLS WITH OR WITHOUT FEVER  NAUSEA AND VOMITING THAT IS NOT CONTROLLED WITH YOUR NAUSEA MEDICATION  *UNUSUAL SHORTNESS OF BREATH  *UNUSUAL BRUISING OR BLEEDING  TENDERNESS IN MOUTH AND THROAT WITH OR WITHOUT PRESENCE OF ULCERS  *URINARY PROBLEMS  *BOWEL PROBLEMS  UNUSUAL RASH Items with * indicate a potential emergency and should be followed up as soon as possible.  Feel free to call the clinic you have any questions or concerns. The clinic phone number is [phone].  Please show the CHEMO ALERT CARD at check-in to the Emergency Department and triage nurse.   

## 2016-08-01 ENCOUNTER — Other Ambulatory Visit: Payer: Self-pay

## 2016-08-01 DIAGNOSIS — C541 Malignant neoplasm of endometrium: Secondary | ICD-10-CM

## 2016-08-01 MED ORDER — DEXAMETHASONE 4 MG PO TABS: ORAL_TABLET | ORAL | 0 refills | Status: DC

## 2016-08-01 NOTE — Telephone Encounter (Signed)
Refilled decadron 4 mg  Tabs for patient.

## 2016-08-04 ENCOUNTER — Other Ambulatory Visit: Payer: Self-pay | Admitting: Oncology

## 2016-08-05 ENCOUNTER — Telehealth: Payer: Self-pay

## 2016-08-05 DIAGNOSIS — C541 Malignant neoplasm of endometrium: Secondary | ICD-10-CM

## 2016-08-05 MED ORDER — DEXAMETHASONE 4 MG PO TABS: ORAL_TABLET | ORAL | 0 refills | Status: DC

## 2016-08-05 NOTE — Telephone Encounter (Signed)
Pt called for decadron refill and asked about changing 9/21 lab/chemo to later in afternoon. Refilled decadron and 9/21 already changed to later in afternoon. Pt was pleased.

## 2016-08-06 ENCOUNTER — Encounter: Payer: Self-pay | Admitting: *Deleted

## 2016-08-06 DIAGNOSIS — F5101 Primary insomnia: Secondary | ICD-10-CM | POA: Diagnosis not present

## 2016-08-06 DIAGNOSIS — R030 Elevated blood-pressure reading, without diagnosis of hypertension: Secondary | ICD-10-CM | POA: Diagnosis not present

## 2016-08-06 NOTE — Progress Notes (Signed)
[facility] Psychosocial Distress Screening Clinical Social Work  Clinical Social Work was referred by distress screening protocol.  The patient scored a 5 on the Psychosocial Distress Thermometer which indicates moderate distress. Clinical Social Worker contacted patient by phone to assess for distress and other psychosocial needs. Teresa Norris shared she is having difficulty coping with chemotherapy related side effects and is tearful often.  Patient lives at [facility] with her spouse and has many supportive friends. She plans to meet with Dr. Felipa Eth to address blood pressure issues, CSW encouraged patient to discuss tearfulness and managing emotions with her primary physician.  CSW normalized and validated patient's feelings of sadness and anxiety during cancer process.  CSW provided information on free adjustment counseling at cancer center.  Patient shared she is also aware of LCSW support through [employer] and [employer].  CSW agreed to stop by infusion to make introduction at patient's next chemotherapy appointment.  ONCBCN DISTRESS SCREENING 07/02/2016  Screening Type Initial Screening  Distress experienced in past week (1-10) 5  Family Problem type Partner  Emotional problem type Nervousness/Anxiety  Physical Problem type Sleep/insomnia;Constipation/diarrhea  Referral to clinical social work     Teresa Norris, MSW, LCSW, OSW-C Clinical Social Worker [facility] [phone]

## 2016-08-07 ENCOUNTER — Ambulatory Visit (HOSPITAL_BASED_OUTPATIENT_CLINIC_OR_DEPARTMENT_OTHER): Payer: PPO

## 2016-08-07 ENCOUNTER — Encounter: Payer: Self-pay | Admitting: Oncology

## 2016-08-07 ENCOUNTER — Other Ambulatory Visit (HOSPITAL_BASED_OUTPATIENT_CLINIC_OR_DEPARTMENT_OTHER): Payer: PPO

## 2016-08-07 ENCOUNTER — Ambulatory Visit (HOSPITAL_BASED_OUTPATIENT_CLINIC_OR_DEPARTMENT_OTHER): Payer: PPO | Admitting: Oncology

## 2016-08-07 VITALS — BP 152/96 | HR 97 | Temp 97.9°F | Resp 18 | Ht 66.0 in | Wt 166.6 lb

## 2016-08-07 DIAGNOSIS — C55 Malignant neoplasm of uterus, part unspecified: Secondary | ICD-10-CM

## 2016-08-07 DIAGNOSIS — C541 Malignant neoplasm of endometrium: Secondary | ICD-10-CM

## 2016-08-07 DIAGNOSIS — Z5111 Encounter for antineoplastic chemotherapy: Secondary | ICD-10-CM

## 2016-08-07 LAB — COMPREHENSIVE METABOLIC PANEL
ALBUMIN: 3.6 g/dL (ref 3.5–5.0)
ALK PHOS: 70 U/L (ref 40–150)
ALT: 21 U/L (ref 0–55)
ANION GAP: 10 meq/L (ref 3–11)
AST: 15 U/L (ref 5–34)
BILIRUBIN TOTAL: 0.36 mg/dL (ref 0.20–1.20)
BUN: 25 mg/dL (ref 7.0–26.0)
CALCIUM: 9.5 mg/dL (ref 8.4–10.4)
CO2: 20 meq/L — AB (ref 22–29)
CREATININE: 0.8 mg/dL (ref 0.6–1.1)
Chloride: 106 mEq/L (ref 98–109)
EGFR: 64 mL/min/{1.73_m2} — AB (ref >90)
Glucose: 136 mg/dl (ref 70–140)
Potassium: 4.3 mEq/L (ref 3.5–5.1)
Sodium: 137 mEq/L (ref 136–145)
Total Protein: 6.9 g/dL (ref 6.4–8.3)

## 2016-08-07 LAB — CBC WITH DIFFERENTIAL/PLATELET
BASO%: 0 % (ref 0.0–2.0)
Basophils Absolute: 0 10e3/uL (ref 0.0–0.1)
EOS%: 0 % (ref 0.0–7.0)
Eosinophils Absolute: 0 10e3/uL (ref 0.0–0.5)
HCT: 39.4 % (ref 34.8–46.6)
HGB: 13.6 g/dL (ref 11.6–15.9)
LYMPH%: 8.4 % — ABNORMAL LOW (ref 14.0–49.7)
MCH: 30.6 pg (ref 25.1–34.0)
MCHC: 34.5 g/dL (ref 31.5–36.0)
MCV: 88.5 fL (ref 79.5–101.0)
MONO#: 0.1 10e3/uL (ref 0.1–0.9)
MONO%: 1.6 % (ref 0.0–14.0)
NEUT#: 3.3 10e3/uL (ref 1.5–6.5)
NEUT%: 90 % — ABNORMAL HIGH (ref 38.4–76.8)
Platelets: 211 10e3/uL (ref 145–400)
RBC: 4.45 10e6/uL (ref 3.70–5.45)
RDW: 14.6 % — ABNORMAL HIGH (ref 11.2–14.5)
WBC: 3.7 10e3/uL — ABNORMAL LOW (ref 3.9–10.3)
lymph#: 0.3 10e3/uL — ABNORMAL LOW (ref 0.9–3.3)

## 2016-08-07 MED ORDER — FAMOTIDINE IN NACL 20-0.9 MG/50ML-% IV SOLN
20.0000 mg | Freq: Once | INTRAVENOUS | Status: AC
  Administered 2016-08-07: 20 mg via INTRAVENOUS

## 2016-08-07 MED ORDER — FOSAPREPITANT DIMEGLUMINE INJECTION 150 MG
Freq: Once | INTRAVENOUS | Status: AC
  Administered 2016-08-07: 13:00:00 via INTRAVENOUS
  Filled 2016-08-07: qty 5

## 2016-08-07 MED ORDER — DIPHENHYDRAMINE HCL 50 MG/ML IJ SOLN
INTRAMUSCULAR | Status: AC
  Filled 2016-08-07: qty 1

## 2016-08-07 MED ORDER — DIPHENHYDRAMINE HCL 50 MG/ML IJ SOLN
25.0000 mg | Freq: Once | INTRAMUSCULAR | Status: AC
  Administered 2016-08-07: 25 mg via INTRAVENOUS

## 2016-08-07 MED ORDER — SODIUM CHLORIDE 0.9 % IV SOLN
Freq: Once | INTRAVENOUS | Status: AC
  Administered 2016-08-07: 13:00:00 via INTRAVENOUS

## 2016-08-07 MED ORDER — FAMOTIDINE IN NACL 20-0.9 MG/50ML-% IV SOLN
INTRAVENOUS | Status: AC
  Filled 2016-08-07: qty 50

## 2016-08-07 MED ORDER — SODIUM CHLORIDE 0.9 % IV SOLN
294.8000 mg | Freq: Once | INTRAVENOUS | Status: AC
  Administered 2016-08-07: 290 mg via INTRAVENOUS
  Filled 2016-08-07: qty 29

## 2016-08-07 MED ORDER — SODIUM CHLORIDE 0.9 % IV SOLN
Freq: Once | INTRAVENOUS | Status: AC
  Administered 2016-08-07: 13:00:00 via INTRAVENOUS
  Filled 2016-08-07: qty 4

## 2016-08-07 MED ORDER — SODIUM CHLORIDE 0.9 % IV SOLN
80.0000 mg/m2 | Freq: Once | INTRAVENOUS | Status: AC
  Administered 2016-08-07: 150 mg via INTRAVENOUS
  Filled 2016-08-07: qty 25

## 2016-08-07 NOTE — Progress Notes (Signed)
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 She is due day 1 cycle 3 today. She has not needed gCSF thus far. Plan is for radiation after cycle 3 chemo, then additional chemo thru cycle 6.    Patient is having a difficult time tolerating fatigue from the chemotherapy, tho she doing some activities with friends. She has had no significant nausea, bowels move daily with no rectal bleeding or hemorrhoidal discomfort recently. No peripheral neuropathy. She has had occasional epistaxis, resolved without specific intervention, no other bleeding. Dr Felipa Eth is following BP off of antihypertensives for now, next visit 3 weeks. She notices slight puffiness right face compared with left, not new, no oral or jaw pain, denies sleeping primarily on that side. No SOB with present activity level. Not sleeping well, will try low dose ambien per Dr Felipa Eth, which she used in past (again slept well last pm after premed steroids with ice cream and mashed potatoes at night) Cries easily. No fever. No LE swelling. Peripheral IV access not easy but still seems adequate. Remainder of 10 point Review of Systems negative  No central catheter No genetics testing. MMR testing normal/ no apparent MSI  ONCOLOGIC HISTORY Patient presented with 2 episodes of vaginal bleeding. She was seen by Dr Simona Huh, with US showing unremarkable uterus and ovaries; endometrial biopsy found adenocarcinoma. She was referred to Dr Josephina Shih on 05-02-16, with robotic assisted total hysterectomy, BSO, SLN biopsy and right pelvic lymphadnectomy by Dr Denman George on 05/06/16. Intraoperative findings were of 12cm fibroid uterus, intramural fibroids, no suspicious  extrauterine disease, failed mapping on right side Pathology ([id number]) revealed a 6cm grade 2 tumor that was 100% invaded through the myometrium (4.3 of 4.3cm) with LVSI present. The adnexa and cervix were negative for tumor, however there were 3 pelvic lymph nodes positive for macrometastatic disease (>4m). MMR testing normal. CT CAP 05-22-16 showed no apparent metastatic disease, with possible 7 mm mucoid impaction in RLL lung. Recommendation for adjuvant therapy with carboplatin taxol given in sandwich fashion with external beam radiation + vaginal brachytherapy. Cycle 1 was delayed a week due to head trauma and bleeding hemorrhoids, day 1 cycle 1 dose dense carbo taxol begun 06-26-16   Objective:  Vital signs in last 24 hours:  BP (!) 152/96 (BP Location: Left Arm, Patient Position: Sitting)   Pulse 97   Temp 97.9 F (36.6 C) (Oral)   Resp 18   Ht 5' 6" (1.676 m)   Wt 166 lb 9.6 oz (75.6 kg)   SpO2 100%   BMI 26.89 kg/m  Weight up 1 lb Alert, oriented and appropriate. Ambulatory without assistance.  Alopecia  HEENT:PERRL, sclerae not icteric. Oral mucosa moist without lesions, posterior pharynx clear.  No JVD.  Lymphatics:no cervical,supraclavicular, axillary or inguinal adenopathy Resp: clear to auscultation bilaterally and normal percussion bilaterally Cardio: regular rate and rhythm. No gallop. GI: soft, nontender, not distended, no mass or organomegaly. Normally active bowel sounds. Surgical incisions not remarkable. Musculoskeletal/ Extremities: LE without pitting edema, cords, tenderness. Muscle changes LLE post polio Neuro: no peripheral neuropathy. Otherwise nonfocal. PSYCH appropriate mood and affect Skin without rash, ecchymosis, petechiae   Lab Results:  Results for orders placed or performed in visit on 08/07/16  CBC with Differential  Result Value Ref Range   WBC 3.7 (L) 3.9 - 10.3 10e3/uL   NEUT# 3.3 1.5 - 6.5 10e3/uL   HGB 13.6 11.6 - 15.9 g/dL   HCT  39.4 34.8 - 46.6 %   Platelets 211 145 - 400 10e3/uL   MCV 88.5 79.5 - 101.0 fL   MCH 30.6 25.1 - 34.0 pg   MCHC 34.5 31.5 - 36.0 g/dL   RBC 4.45 3.70 - 5.45 10e6/uL   RDW 14.6 (H) 11.2 - 14.5 %   lymph# 0.3 (L) 0.9 - 3.3 10e3/uL   MONO# 0.1 0.1 - 0.9 10e3/uL   Eosinophils Absolute 0.0 0.0 - 0.5 10e3/uL   Basophils Absolute 0.0 0.0 - 0.1 10e3/uL   NEUT% 90.0 (H) 38.4 - 76.8 %   LYMPH% 8.4 (L) 14.0 - 49.7 %   MONO% 1.6 0.0 - 14.0 %   EOS% 0.0 0.0 - 7.0 %   BASO% 0.0 0.0 - 2.0 %  Comprehensive metabolic panel  Result Value Ref Range   Sodium 137 136 - 145 mEq/L   Potassium 4.3 3.5 - 5.1 mEq/L   Chloride 106 98 - 109 mEq/L   CO2 20 (L) 22 - 29 mEq/L   Glucose 136 70 - 140 mg/dl   BUN 25.0 7.0 - 26.0 mg/dL   Creatinine 0.8 0.6 - 1.1 mg/dL   Total Bilirubin 0.36 0.20 - 1.20 mg/dL   Alkaline Phosphatase 70 40 - 150 U/L   AST 15 5 - 34 U/L   ALT 21 0 - 55 U/L   Total Protein 6.9 6.4 - 8.3 g/dL   Albumin 3.6 3.5 - 5.0 g/dL   Calcium 9.5 8.4 - 10.4 mg/dL   Anion Gap 10 3 - 11 mEq/L   EGFR 64 (L) >90 ml/min/1.73 m2     Studies/Results:  No results found.  Medications: I have reviewed the patient's current medications. She will try low dose ambien per Dr Felipa Eth, tho did not take this last pm prior to chemo today.  DISCUSSION Interval history reviewed. She is willing to continue treatment, appreciates assistance from friends.  Message sent to radiation oncology re scheduling there, as cycle 3 will omplete 08-21-16.  Assessment/Plan:  1.IIIC1 grade 2 endometrioid endometrial carcinoma in 82 yo lady: post robotic assisted hysterectomy BSO left sentinel and right pelvic lymphadnectomy 05-06-16. Adjuvant chemotherapy  in sandwich fashion with radiation, 3 cycles of chemo prior to pelvic radiation then 3 cycles after. For day 1 cycle 3 today. She will have day 8 and day 15 cycle 3 as long as ANC >=1.5 and plt >=100k. I will see her on 08-18-16.  2 Epistaxis and bleeding apparently from  hemorrhoids in past few weeks, platelets ok, no bleeding now. 3.Constipation after first treatment, resolved, and long history of hemorrhoids: encouraged her to manage this problem proactively while on chemo. 4.post thyroid ablation remotely, on replacement by Dr Felipa Eth 5.post cataract extractions with lens implants 6.advance directives in place, copy requested for this EMR 7.osteopenia per EMR 8. In living facility with husband and good assistance. Family very attentive.  9.polio age 34 affecting LLE     All questions answered and she knows to call if needed prior to next scheduled appointments. Chemo orders confirmed. Time spent 25 min including >50% counseling and coordination of care  Route Dr Felipa Eth, cc Dr Delena Serve, MD   08/07/2016, 12:23 PM

## 2016-08-07 NOTE — Patient Instructions (Signed)
[facility] Discharge Instructions for Patients Receiving Chemotherapy  Today you received the following chemotherapy agents: Taxol and Carboplatin.      To help prevent nausea and vomiting after your treatment, we encourage you to take your nausea medication: Zofran. Take one every 8 hours as needed. You may also take Compazine (may make you drowsy) every 6 hours as needed for nausea not relieved by Zofran.   If you develop nausea and vomiting that is not controlled by your nausea medication, call the clinic.   BELOW ARE SYMPTOMS THAT SHOULD BE REPORTED IMMEDIATELY:  *FEVER GREATER THAN 100.5 F  *CHILLS WITH OR WITHOUT FEVER  NAUSEA AND VOMITING THAT IS NOT CONTROLLED WITH YOUR NAUSEA MEDICATION  *UNUSUAL SHORTNESS OF BREATH  *UNUSUAL BRUISING OR BLEEDING  TENDERNESS IN MOUTH AND THROAT WITH OR WITHOUT PRESENCE OF ULCERS  *URINARY PROBLEMS  *BOWEL PROBLEMS  UNUSUAL RASH Items with * indicate a potential emergency and should be followed up as soon as possible.  Feel free to call the clinic should you have any questions or concerns. The clinic phone number is [phone].  Please show the [place] at check-in to the Emergency Department and triage nurse.

## 2016-08-08 DIAGNOSIS — Z5111 Encounter for antineoplastic chemotherapy: Secondary | ICD-10-CM | POA: Insufficient documentation

## 2016-08-12 NOTE — Addendum Note (Signed)
Encounter addended by: Jacqulyn Liner, RN on: 08/12/2016  4:33 PM<BR>    Actions taken: Charge Capture section accepted

## 2016-08-13 ENCOUNTER — Encounter: Payer: Self-pay | Admitting: Podiatry

## 2016-08-13 ENCOUNTER — Ambulatory Visit: Payer: PPO | Admitting: Podiatry

## 2016-08-13 ENCOUNTER — Ambulatory Visit (INDEPENDENT_AMBULATORY_CARE_PROVIDER_SITE_OTHER): Payer: PPO | Admitting: Podiatry

## 2016-08-13 VITALS — Ht 66.0 in | Wt 166.0 lb

## 2016-08-13 DIAGNOSIS — M79676 Pain in unspecified toe(s): Secondary | ICD-10-CM

## 2016-08-13 DIAGNOSIS — B351 Tinea unguium: Secondary | ICD-10-CM | POA: Diagnosis not present

## 2016-08-13 NOTE — Progress Notes (Signed)
Complaint:  Visit Type: Patient returns to my office for continued preventative foot care services. Complaint: Patient states" my nails have grown long and thick and become painful to walk and wear shoes"  The patient presents for preventative foot care services. Patient under treatment for cancer.  Podiatric Exam: Vascular: dorsalis pedis and posterior tibial pulses are palpable bilateral. Capillary return is immediate. Temperature gradient is WNL. Skin turgor WNL  Sensorium: Normal Semmes Weinstein monofilament test. Normal tactile sensation bilaterally. Nail Exam: Pt has thick disfigured discolored nails with subungual debris noted bilateral entire nail hallux through fifth toenails Ulcer Exam: There is no evidence of ulcer or pre-ulcerative changes or infection. Orthopedic Exam: Muscle tone and strength are WNL. No limitations in general ROM. No crepitus or effusions noted. Foot type and digits show no abnormalities. Bony prominences are unremarkable. Skin: No Porokeratosis. No infection or ulcers  Diagnosis:  Onychomycosis, , Pain in right toe, pain in left toes  Treatment & Plan Procedures and Treatment: Consent by patient was obtained for treatment procedures. The patient understood the discussion of treatment and procedures well. All questions were answered thoroughly reviewed. Debridement of mycotic and hypertrophic toenails, 1 through 5 bilateral and clearing of subungual debris. No ulceration, no infection noted.  Return Visit-Office Procedure: Patient instructed to return to the office for a follow up visit 3 months for continued evaluation and treatment.    Gardiner Barefoot DPM

## 2016-08-14 ENCOUNTER — Ambulatory Visit (HOSPITAL_BASED_OUTPATIENT_CLINIC_OR_DEPARTMENT_OTHER): Payer: PPO

## 2016-08-14 ENCOUNTER — Encounter: Payer: Self-pay | Admitting: Oncology

## 2016-08-14 ENCOUNTER — Other Ambulatory Visit: Payer: Self-pay | Admitting: Oncology

## 2016-08-14 ENCOUNTER — Other Ambulatory Visit (HOSPITAL_BASED_OUTPATIENT_CLINIC_OR_DEPARTMENT_OTHER): Payer: PPO

## 2016-08-14 VITALS — BP 160/98 | HR 101 | Temp 97.6°F

## 2016-08-14 DIAGNOSIS — C541 Malignant neoplasm of endometrium: Secondary | ICD-10-CM

## 2016-08-14 DIAGNOSIS — D701 Agranulocytosis secondary to cancer chemotherapy: Secondary | ICD-10-CM

## 2016-08-14 DIAGNOSIS — C55 Malignant neoplasm of uterus, part unspecified: Secondary | ICD-10-CM | POA: Diagnosis not present

## 2016-08-14 LAB — CBC WITH DIFFERENTIAL/PLATELET
BASO%: 0 % (ref 0.0–2.0)
Basophils Absolute: 0 10*3/uL (ref 0.0–0.1)
EOS%: 0 % (ref 0.0–7.0)
Eosinophils Absolute: 0 10*3/uL (ref 0.0–0.5)
HCT: 37.9 % (ref 34.8–46.6)
HGB: 13.1 g/dL (ref 11.6–15.9)
LYMPH%: 16 % (ref 14.0–49.7)
MCH: 30.4 pg (ref 25.1–34.0)
MCHC: 34.6 g/dL (ref 31.5–36.0)
MCV: 87.9 fL (ref 79.5–101.0)
MONO#: 0 10*3/uL — AB (ref 0.1–0.9)
MONO%: 1.8 % (ref 0.0–14.0)
NEUT%: 82.2 % — AB (ref 38.4–76.8)
NEUTROS ABS: 1.4 10*3/uL — AB (ref 1.5–6.5)
PLATELETS: 172 10*3/uL (ref 145–400)
RBC: 4.31 10*6/uL (ref 3.70–5.45)
RDW: 14.7 % — ABNORMAL HIGH (ref 11.2–14.5)
WBC: 1.7 10*3/uL — AB (ref 3.9–10.3)
lymph#: 0.3 10*3/uL — ABNORMAL LOW (ref 0.9–3.3)

## 2016-08-14 LAB — COMPREHENSIVE METABOLIC PANEL
ALT: 19 U/L (ref 0–55)
ANION GAP: 11 meq/L (ref 3–11)
AST: 14 U/L (ref 5–34)
Albumin: 3.7 g/dL (ref 3.5–5.0)
Alkaline Phosphatase: 75 U/L (ref 40–150)
BUN: 22.3 mg/dL (ref 7.0–26.0)
CHLORIDE: 105 meq/L (ref 98–109)
CO2: 20 meq/L — AB (ref 22–29)
CREATININE: 0.9 mg/dL (ref 0.6–1.1)
Calcium: 9.5 mg/dL (ref 8.4–10.4)
EGFR: 58 mL/min/{1.73_m2} — ABNORMAL LOW (ref >90)
GLUCOSE: 167 mg/dL — AB (ref 70–140)
Potassium: 4.5 mEq/L (ref 3.5–5.1)
SODIUM: 136 meq/L (ref 136–145)
TOTAL PROTEIN: 6.9 g/dL (ref 6.4–8.3)
Total Bilirubin: 0.36 mg/dL (ref 0.20–1.20)

## 2016-08-14 MED ORDER — TBO-FILGRASTIM 300 MCG/0.5ML ~~LOC~~ SOSY
300.0000 ug | PREFILLED_SYRINGE | Freq: Once | SUBCUTANEOUS | Status: AC
  Administered 2016-08-14: 300 ug via SUBCUTANEOUS
  Filled 2016-08-14: qty 0.5

## 2016-08-14 NOTE — Patient Instructions (Signed)
Neutropenia Neutropenia is a condition that occurs when the level of a certain type of white blood cell (neutrophil) in your body becomes lower than normal. Neutrophils are made in the bone marrow and fight infections. These cells protect against bacteria and viruses. The fewer neutrophils you have, and the longer your body remains without them, the greater your risk of getting a severe infection becomes. CAUSES  The cause of neutropenia may be hard to determine. However, it is usually due to 3 main problems:   Decreased production of neutrophils. This may be due to:  Certain medicines such as chemotherapy.  Genetic problems.  Cancer.  Radiation treatments.  Vitamin deficiency.  Some pesticides.  Increased destruction of neutrophils. This may be due to:  Overwhelming infections.  Hemolytic anemia. This is when the body destroys its own blood cells.  Chemotherapy.  Neutrophils moving to areas of the body where they cannot fight infections. This may be due to:  Dialysis procedures.  Conditions where the spleen becomes enlarged. Neutrophils are held in the spleen and are not available to the rest of the body.  Overwhelming infections. The neutrophils are held in the area of the infection and are not available to the rest of the body. SYMPTOMS  There are no specific symptoms of neutropenia. The lack of neutrophils can result in an infection, and an infection can cause various problems. DIAGNOSIS  Diagnosis is made by a blood test. A complete blood count is performed. The normal level of neutrophils in human blood differs with age and race. Infants have lower counts than older children and adults. African Americans have lower counts than Caucasians or Asians. The average adult level is 1500 cells/mm3 of blood. Neutrophil counts are interpreted as follows:  Greater than 1000 cells/mm3 gives normal protection against infection.  500 to 1000 cells/mm3 gives an increased risk for  infection.  200 to 500 cells/mm3 is a greater risk for severe infection.  Lower than 200 cells/mm3 is a marked risk of infection. This may require hospitalization and treatment with antibiotic medicines. TREATMENT  Treatment depends on the underlying cause, severity, and presence of infections or symptoms. It also depends on your health. Your caregiver will discuss the treatment plan with you. Mild cases are often easily treated and have a good outcome. Preventative measures may also be started to limit your risk of infections. Treatment can include:  Taking antibiotics.  Stopping medicines that are known to cause neutropenia.  Correcting nutritional deficiencies by eating green vegetables to supply folic acid and taking vitamin B supplements.  Stopping exposure to pesticides if your neutropenia is related to pesticide exposure.  Taking a blood growth factor called sargramostim, pegfilgrastim, or filgrastim if you are undergoing chemotherapy for cancer. This stimulates white blood cell production.  Removal of the spleen if you have Felty's syndrome and have repeated infections. HOME CARE INSTRUCTIONS   Follow your caregiver's instructions about when you need to have blood work done.  Wash your hands often. Make sure others who come in contact with you also wash their hands.  Wash raw fruits and vegetables before eating them. They can carry bacteria and fungi.  Avoid people with colds or spreadable (contagious) diseases (chickenpox, herpes zoster, influenza).  Avoid large crowds.  Avoid construction areas. The dust can release fungus into the air.  Be cautious around children in daycare or school environments.  Take care of your respiratory system by coughing and deep breathing.  Bathe daily.  Protect your skin from cuts and   burns.  Do not work in the garden or with flowers and plants.  Care for the mouth before and after meals by brushing with a soft toothbrush. If you have  mucositis, do not use mouthwash. Mouthwash contains alcohol and can dry out the mouth even more.  Clean the area between the genitals and the anus (perineal area) after urination and bowel movements. Women need to wipe from front to back.  Use a water soluble lubricant during sexual intercourse and practice good hygiene after. Do not have intercourse if you are severely neutropenic. Check with your caregiver for guidelines.  Exercise daily as tolerated.  Avoid people who were vaccinated with a live vaccine in the past 30 days. You should not receive live vaccines (polio, typhoid).  Do not provide direct care for pets. Avoid animal droppings. Do not clean litter boxes and bird cages.  Do not share food utensils.  Do not use tampons, enemas, or rectal suppositories unless directed by your caregiver.  Use an electric razor to remove hair.  Wash your hands after handling magazines, letters, and newspapers. SEEK IMMEDIATE MEDICAL CARE IF:   You have a fever.  You have chills or start to shake.  You feel nauseous or vomit.  You develop mouth sores.  You develop aches and pains.  You have redness and swelling around open wounds.  Your skin is warm to the touch.  You have pus coming from your wounds.  You develop swollen lymph nodes.  You feel weak or fatigued.  You develop red streaks on the skin. MAKE SURE YOU:  Understand these instructions.  Will watch your condition.  Will get help right away if you are not doing well or get worse.   This information is not intended to replace advice given to you by your health care provider. Make sure you discuss any questions you have with your health care provider.   Document Released: 05/09/2002 Document Revised: 02/09/2012 Document Reviewed: 05/30/2015 Elsevier Interactive Patient Education 2016 [employer]  

## 2016-08-14 NOTE — Progress Notes (Signed)
Dr. Marko Plume called with neutrophils of 1.4, will not get treatment today. To get Granix as ordered today.

## 2016-08-14 NOTE — Progress Notes (Signed)
Medical Oncology  ANC 1.4 today.  Day 8 cycle 3 held.  Granix 300 mcg given, this the first gCSF administered. Infusion RN to do teaching for granix. Added CBC to MD visit on 9-18.  L.Tatumn Corbridge MD

## 2016-08-15 ENCOUNTER — Telehealth: Payer: Self-pay

## 2016-08-15 NOTE — Telephone Encounter (Signed)
Pt called that her bones are all achey after her granix shot . Her sternum, her costochondritis, her arms. She denies fever. Explained her bones are working to produce blood cells. Encouraged heating pad, tylenol, claritin. She can call doctor on call if needing anything over the weekend, if claritin and tylenol do not help.

## 2016-08-17 ENCOUNTER — Other Ambulatory Visit: Payer: Self-pay | Admitting: Oncology

## 2016-08-18 ENCOUNTER — Other Ambulatory Visit (HOSPITAL_BASED_OUTPATIENT_CLINIC_OR_DEPARTMENT_OTHER): Payer: PPO

## 2016-08-18 ENCOUNTER — Encounter: Payer: Self-pay | Admitting: Oncology

## 2016-08-18 ENCOUNTER — Ambulatory Visit (HOSPITAL_BASED_OUTPATIENT_CLINIC_OR_DEPARTMENT_OTHER): Payer: PPO | Admitting: Oncology

## 2016-08-18 VITALS — BP 153/85 | HR 98 | Temp 98.0°F | Resp 18 | Wt 165.6 lb

## 2016-08-18 DIAGNOSIS — R530 Neoplastic (malignant) related fatigue: Secondary | ICD-10-CM | POA: Diagnosis not present

## 2016-08-18 DIAGNOSIS — M8588 Other specified disorders of bone density and structure, other site: Secondary | ICD-10-CM | POA: Diagnosis not present

## 2016-08-18 DIAGNOSIS — C541 Malignant neoplasm of endometrium: Secondary | ICD-10-CM | POA: Diagnosis not present

## 2016-08-18 DIAGNOSIS — Z5111 Encounter for antineoplastic chemotherapy: Secondary | ICD-10-CM

## 2016-08-18 LAB — CBC WITH DIFFERENTIAL/PLATELET
BASO%: 0.9 % (ref 0.0–2.0)
Basophils Absolute: 0 10*3/uL (ref 0.0–0.1)
EOS ABS: 0.1 10*3/uL (ref 0.0–0.5)
EOS%: 2.6 % (ref 0.0–7.0)
HEMATOCRIT: 38.1 % (ref 34.8–46.6)
HGB: 13.1 g/dL (ref 11.6–15.9)
LYMPH%: 23.6 % (ref 14.0–49.7)
MCH: 30.8 pg (ref 25.1–34.0)
MCHC: 34.4 g/dL (ref 31.5–36.0)
MCV: 89.4 fL (ref 79.5–101.0)
MONO#: 0.7 10*3/uL (ref 0.1–0.9)
MONO%: 14.9 % — AB (ref 0.0–14.0)
NEUT%: 58 % (ref 38.4–76.8)
NEUTROS ABS: 2.7 10*3/uL (ref 1.5–6.5)
NRBC: 0 % (ref 0–0)
PLATELETS: 168 10*3/uL (ref 145–400)
RBC: 4.26 10*6/uL (ref 3.70–5.45)
RDW: 15.8 % — AB (ref 11.2–14.5)
WBC: 4.6 10*3/uL (ref 3.9–10.3)
lymph#: 1.1 10*3/uL (ref 0.9–3.3)

## 2016-08-18 NOTE — Progress Notes (Signed)
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 She had day 1 cycle 3 on 08-07-16, with day 8 cycle 3 delayed a week due to leukopenia. Plan has been radiation after cycle 3 then additional 3 cycles of chemotherapy, however patient is reluctant to continue chemo.   Patient reports that she was extremely fatigued thru this past weekend, stayed in bed while family visited. Note she takes 1/2 of 5 mg ambien at 0100 "it does not last all night so I do not take it when I go to bed, only when I wake up again" , but denies feeling groggy from the [place] into the day. Appetite is decreased tho she is eating, no significant nausea. She had some pain arms and chest after granix shot on 9-14, now resolved. Reports blood pressure much better at home than in office, Dr Felipa Eth following this up. Bowels are moving regularly, no problems now with hemorrhoids. Peripheral IV access not easy, 3 attempts with last chemo. Podiatrist trimmed toenails and gave heel pad. No fever or symptoms of infection. Minimal peripheral neuropathy tips of fingers, not bothersome.  Remainder of 10 point Review of Systems negative.   No central catheter No genetics testing. MMR testing normal/ no apparent MSI  ONCOLOGIC HISTORY Patient presented with 2 episodes of vaginal bleeding. She was seen by Dr Simona Huh, with US showing unremarkable uterus and ovaries; endometrial biopsy found adenocarcinoma. She was referred to Dr Josephina Shih on 05-02-16, with robotic assisted total hysterectomy, BSO, SLN biopsy and right pelvic lymphadnectomy by Dr Denman George on 05/06/16. Intraoperative findings were of 12cm fibroid uterus, intramural fibroids, no suspicious  extrauterine disease, failed mapping on right side Pathology ([id number]) revealed a 6cm grade 2 tumor that was 100% invaded through the myometrium (4.3 of 4.3cm) with LVSI present. The adnexa and cervix were negative for tumor, however there were 3 pelvic lymph nodes positive for macrometastatic disease (>72m). MMR testing normal.CT CAP 05-22-16 showed no apparent metastatic disease, with possible 7 mm mucoid impaction in RLL lung. Recommendation for adjuvant therapy with carboplatin taxol given in sandwich fashion with external beam radiation + vaginal brachytherapy. Cycle 1 was delayed a week due to head trauma and bleeding hemorrhoids, day 1 cycle 1 dose dense carbo taxol begun 06-26-16, used thru day 1 cycle 3 on 08-07-16 when patient preferred to DC.   Objective:  Vital signs in last 24 hours:  BP (!) 153/85 (BP Location: Right Arm, Patient Position: Sitting)   Pulse 98   Temp 98 F (36.7 C) (Oral)   Resp 18   Wt 165 lb 9.6 oz (75.1 kg)   SpO2 97%   BMI 26.73 kg/m  Weight down 1 lb. Tearful during some of visit. Alert, oriented and cooperative.  Ambulatory slowly without assistance.  Alopecia  HEENT:PERRL, sclerae not icteric. Oral mucosa moist without lesions, posterior pharynx clear.  Neck supple. No JVD.  Lymphatics:no supraclavicular adenopathy Resp: clear to auscultation bilaterally and normal percussion bilaterally Cardio: regular rate and rhythm. No gallop. GI: soft, nontender, not distended, no mass or organomegaly. Normally active bowel sounds.  Musculoskeletal/ Extremities: LE without pitting edema, cords, tenderness. Post polio changes LLE Neuro: no significant peripheral neuropathy. Otherwise nonfocal. PSYCH: crying as she describes disliking side effects of chemo Skin  without rash, petechiae. Ecchymosis left forearm at site of attempted IV, no cord or streaking  Lab Results:  Results for orders placed or performed in visit on 08/18/16  CBC with Differential   Result Value Ref Range   WBC 4.6 3.9 - 10.3 10e3/uL   NEUT# 2.7 1.5 - 6.5 10e3/uL   HGB 13.1 11.6 - 15.9 g/dL   HCT 38.1 34.8 - 46.6 %   Platelets 168 145 - 400 10e3/uL   MCV 89.4 79.5 - 101.0 fL   MCH 30.8 25.1 - 34.0 pg   MCHC 34.4 31.5 - 36.0 g/dL   RBC 4.26 3.70 - 5.45 10e6/uL   RDW 15.8 (H) 11.2 - 14.5 %   lymph# 1.1 0.9 - 3.3 10e3/uL   MONO# 0.7 0.1 - 0.9 10e3/uL   Eosinophils Absolute 0.1 0.0 - 0.5 10e3/uL   Basophils Absolute 0.0 0.0 - 0.1 10e3/uL   NEUT% 58.0 38.4 - 76.8 %   LYMPH% 23.6 14.0 - 49.7 %   MONO% 14.9 (H) 0.0 - 14.0 %   EOS% 2.6 0.0 - 7.0 %   BASO% 0.9 0.0 - 2.0 %   nRBC 0 0 - 0 %    CMET with glucose 167 on steroids, otherwise WNL  Studies/Results:  No results found.  Medications: I have reviewed the patient's current medications.  DISCUSSION Interval history discussed, including fatigue, difficult IV access, interference of medical interventions with desired activities  Patient understands that she does not have to continue chemotherapy, which is her preference now. She does want to proceed with radiation; I have spoken with that office for appointment, set up with Dr Sondra Come for Oct 4. Patient has questions about side effects from radiation, which she will address with radiation oncology. She is willing for me to coordinate another visit at this office ~ midway thru radiation, and understands that we can revisit additional chemo after radiation if she decides to do that.    Assessment/Plan:   1.IIIC1 grade 2 endometrioid endometrial carcinoma in 82 yo lady: post robotic assisted hysterectomy BSO left sentinel and right pelvic lymphadnectomy 05-06-16. Adjuvant chemotherapy planned in sandwich fashion with radiation, however patient does not want to continue chemo at least now. Last treatment was day 1 cycle 3 on 08-07-16. She will see Dr Sondra Come on 09-03-16 and I will coordinate visit again during radiaiton.  2 Epistaxis and bleeding apparently from  hemorrhoids: no recurrent symptoms 3.Constipation after first treatment, resolved, and long history of hemorrhoids:encouraged her to manage this problem proactively while on chemo. 4.post thyroid ablation remotely, on replacement by Dr Felipa Eth 5.post cataract extractions with lens implants 6.advance directives in place, copy requested for this EMR 7.osteopenia per EMR 8. In living facility with husband and good assistance. Family very attentive.  9.polio age 89 affecting LLE  10. BP variable, Dr Felipa Eth managing 90.difficult peripheral IV access: may need PAC if additional chemo.  All questions answered and patient knows to call if concerns prior to next visit. Message to collaborative RNs to coordinate with RT schedule when that is available. Time spent 25 min including >50% counseling and coordination of care. Route Dr Felipa Eth, cc Drs Sondra Come and Leone Payor, MD   08/18/2016, 7:27 PM

## 2016-08-19 ENCOUNTER — Telehealth: Payer: Self-pay

## 2016-08-19 NOTE — Telephone Encounter (Signed)
Told Ms Urdahl that Dr. Marko Plume said that she could make an appointment next week to see the dentist for her chipped tooth. Ms Robuck verbalized understanding.

## 2016-08-21 ENCOUNTER — Ambulatory Visit: Payer: PPO

## 2016-08-21 ENCOUNTER — Other Ambulatory Visit: Payer: PPO

## 2016-08-29 NOTE — Progress Notes (Addendum)
GYN Location of Tumor / Histology: IIIC1 grade 2 endometrioid endometrial carcinoma   Teresa Norris presented with symptoms of: 2 episodes of vaginal bleeding  Biopsies revealed:   05/06/16  Diagnosis 1. Lymph node, sentinel, biopsy, left external iliac - ONE LYMPH NODE POSITIVE FOR METASTATIC ADENOCARCINOMA (1/1). 2. Lymph nodes, regional resection, right pelvic - TWO OF SEVEN LYMPH NODES POSITIVE FOR METASTATIC ADENOCARCINOMA (2/7). 3. Uterus +/- tubes/ovaries, neoplastic, and cervix - INVASIVE MODERATELY DIFFERENTIATED (FIGO GRADE 2) ENDOMETRIOID ADENOCARCINOMA. - TUMOR INVADES THROUGH ENTIRE MYOMETRIUM AND INVOLVES SEROSAL SURFACE. - SEE ONCOLOGY TEMPLATE. ADDITIONAL FINDINGS: - BENIGN BILATERAL OVARIES. - BENIGN BILATERAL FALLOPIAN TUBES WITH BENIGN PARATUBAL CYST FORMATION ON THE LEFT. - MYOMETRIUM DEMONSTRATES A CALCIFIED HYALINIZED LEIOMYOMA.  04/10/16 Diagnosis Endometrium, biopsy - ADENOCARCINOMA. - SEE COMMENT.  Past/Anticipated interventions by Gyn/Onc surgery, if any: 05/06/16 -Procedure: XI ROBOTIC ASSISTED TOTAL HYSTERECTOMY WITH BILATERAL SALPINGO OOPHORECTOMY;  Surgeon: Everitt Amber, MD   Past/Anticipated interventions by medical oncology, if any: adjuvant chemotherapy given in sandwich fashion with radiation, 3 cycles of chemo prior to pelvic radiation then 3 cycles after. Plan to begin using dose dense regimen on 06-19-16.  Patient has completed 3 cycles and is reluctant to continue chemotherapy.  Weight changes, if any: has lost 6 lbs.  Bowel/Bladder complaints, if any: denies having any trouble urinating.  Denies having any bowel issues.  Nausea/Vomiting, if any: no  Pain issues, if any:  Reports having some soreness in her pelvic area at night.  SAFETY ISSUES:  Prior radiation? Yes - keloid on abdomen 70 years ago in [place] also had thyroid treatment when she was 62 at [place].  Pacemaker/ICD? no  Possible current pregnancy? no  Is the patient on  methotrexate? no  Current Complaints / other details:  Patient is here with her friend.  BP (!) 162/92 (BP Location: Right Arm, Patient Position: Sitting)   Pulse 82   Temp 97.8 F (36.6 C) (Oral)   Ht 5\' 6"  (1.676 m)   Wt 166 lb 9.6 oz (75.6 kg)   SpO2 98%   BMI 26.89 kg/m    Wt Readings from Last 3 Encounters:  09/03/16 166 lb 9.6 oz (75.6 kg)  08/18/16 165 lb 9.6 oz (75.1 kg)  08/13/16 166 lb (75.3 kg)

## 2016-09-03 ENCOUNTER — Ambulatory Visit
Admission: RE | Admit: 2016-09-03 | Discharge: 2016-09-03 | Disposition: A | Discharge status: Home / Self Care | Payer: PPO | Source: Ambulatory Visit | Attending: Radiation Oncology | Admitting: Radiation Oncology

## 2016-09-03 VITALS — BP 162/92 | HR 82 | Temp 97.8°F | Ht 66.0 in | Wt 166.6 lb

## 2016-09-03 DIAGNOSIS — C541 Malignant neoplasm of endometrium: Secondary | ICD-10-CM | POA: Diagnosis not present

## 2016-09-03 DIAGNOSIS — C775 Secondary and unspecified malignant neoplasm of intrapelvic lymph nodes: Secondary | ICD-10-CM | POA: Diagnosis not present

## 2016-09-03 DIAGNOSIS — Z510 Encounter for antineoplastic radiation therapy: Secondary | ICD-10-CM | POA: Diagnosis not present

## 2016-09-03 NOTE — Progress Notes (Signed)
Please see the Nurse Progress Note in the MD Initial Consult Encounter for this patient. 

## 2016-09-03 NOTE — Progress Notes (Signed)
Radiation Oncology         [phone] ________________________________  Name: Teresa Norris MRN: [id number]  Date: 09/03/2016  DOB: 01/27/1928  Reevaluation Note  CC: Mathews Argyle, MD  Gordy Levan, MD    ICD-9-CM ICD-10-CM   1. Endometrial ca (HCC) 182.0 C54.1   2. 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   Narrative:  The patient returns today for routine follow-up And planning of her adjuvant radiation therapy.  She had 2 episodes of vaginal bleeding at presentation earlier this year. She was seen by Dr. Simona Huh and had an ultrasound that showed the uterus and ovaries were unremarkable. Endometrial biopsy 04/10/2016 found adenocarcinoma. She was referred to Dr. Fermin Schwab 05/02/2016 and had a robotic assisted total hysterectomy, BSO, SLN biopsy and right pelvic lymphadnectomy by Dr. Denman George 05/06/2016. Pathology revealed a 6 cm grade 2 tumor that was 100% invaded through the myometrium with LVSI present. The adnexa and cervix were negative for tumor but there were 3/8 pelvic lymph nodes positive for macrometastatic disease.  She had a follow up with Dr. Denman George 05/30/2016 and she recommended adjuvant therapy with carboplatin taxol sandwiched with external beam radiation plus vaginal brachytherapy.    Patient has completed day 1 of 3 cycles of adjuvant chemotherapy on 08/07/16 and is reluctant to continue the final 3 treatments.     Patient denies bowel issues, bladder problems, or nausea/vomiting. She reports a 6 lb weight loss. She also notes soreness in her pelvic area at night.                        ALLERGIES:  is allergic to aspirin; advil [ibuprofen]; and e-mycin [erythromycin].  Meds: Current Outpatient Prescriptions  Medication Sig Dispense Refill  . acetaminophen (TYLENOL) 500 MG tablet Take 500-1,000 mg by mouth every 6 (six) hours as needed for mild pain.    .  clonazePAM (KLONOPIN) 0.5 MG tablet TAKE 1 TABLET BY MOUTH AT NIGHT 90 tablet 0  . levothyroxine (SYNTHROID, LEVOTHROID) 100 MCG tablet Take 100 mcg by mouth daily before breakfast.    . polyvinyl alcohol (LIQUIFILM TEARS) 1.4 % ophthalmic solution Place 2 drops into both eyes daily.     Marland Kitchen zolpidem (AMBIEN) 5 MG tablet Take 2.5 mg by mouth at bedtime.    Marland Kitchen dexamethasone (DECADRON) 4 MG tablet Take 5 tablets  with food 12 hrs prior to Taxol Chemotherapy. (Patient not taking: Reported on 09/03/2016) 30 tablet 0  . docusate sodium (COLACE) 100 MG capsule Take 1 capsule (100 mg total) by mouth 2 (two) times daily. (Patient not taking: Reported on 09/03/2016) 30 capsule 0  . hydrocortisone (PROCTO-MED HC) 2.5 % rectal cream Place 1 application rectally daily as needed for hemorrhoids or itching (for internal hemorrhoids). (Patient not taking: Reported on 09/03/2016) 30 g 1  . ondansetron (ZOFRAN) 8 MG tablet Take 1 tablet (8 mg total) by mouth every 8 (eight) hours as needed for nausea. Will not make drowsy. (Patient not taking: Reported on 09/03/2016) 30 tablet 1  . polyethylene glycol (MIRALAX / GLYCOLAX) packet Take 17 g by mouth 2 (two) times daily as needed.    . prochlorperazine (COMPAZINE) 10 MG tablet Take 1 tablet (10 mg total) by mouth every 6 (six) hours as needed for nausea. May make drowsy. (Patient not taking: Reported on 09/03/2016) 30 tablet 0  . senna (SENOKOT) 8.6  MG tablet Take 1 tablet by mouth 2 (two) times daily. Pt has decided to take  1 daily if needed.     No current facility-administered medications for this encounter.     Physical Findings: The patient is in no acute distress. Patient is alert and oriented.  height is 5\' 6"  (1.676 m) and weight is 166 lb 9.6 oz (75.6 kg). Her oral temperature is 97.8 F (36.6 C). Her blood pressure is 162/92 (abnormal) and her pulse is 82. Her oxygen saturation is 98%. .   Lungs are clear to auscultation bilaterally. Heart has regular rate and  rhythm. No palpable cervical, supraclavicular, or axillary adenopathy. Abdomen soft, non-tender, normal bowel sounds. No palpable subclavicular or axillary adenopathy. The lungs are clear to auscultation. The heart has a regular rhythm and rate. The abdomen is soft and nontender with normal bowel sounds. No inguinal adenopathy is appreciated. On pelvic examination, the external genitalia are unremarkable. There are no mucosal lesions noted in the vaginal vault.  On bimanual examination, there are no pelvic masses appreciated. Vaginal cuff is intact.   Lab Findings: Lab Results  Component Value Date   WBC 4.6 08/18/2016   HGB 13.1 08/18/2016   HCT 38.1 08/18/2016   MCV 89.4 08/18/2016   PLT 168 08/18/2016    Radiographic Findings: No results found.  Impression: Stage IIIC1 grade 2 endometrioid endometrial carcinoma. Patient decided not to complete adjuvant chemotherapy.  She would be a good candidate for pelvic radiation therapy and brachytherapy treatments as part of overall management. Discussed course of treatment, side effects, and potential toxicities. Patient appears to understand and wishes to proceed with treatment.   Plan: She will proceed with simulation in the next few days. She will receive 45 Gy to the pelvis followed by 3 intracavitary brachytherapy treatments directed at the vaginal cuff.  ____________________________________  This document serves as a record of services personally performed by Gery Pray, MD. It was created on his behalf by Bethann Humble, a trained medical scribe. The creation of this record is based on the scribe's personal observations and the provider's statements to them. This document has been checked and approved by the attending provider.

## 2016-09-04 ENCOUNTER — Other Ambulatory Visit: Payer: Self-pay | Admitting: Oncology

## 2016-09-08 NOTE — Progress Notes (Signed)
Does patient have an allergy to IV contrast dye?: No.   Has patient ever received premedication for IV contrast dye?: No.   Does patient take metformin?: No.  If patient does take metformin when was the last dose: n/a  Date of lab work: August 14, 2016 BUN: 22.3 CR: 0.9  IV site: antecubital left, condition patent and no redness  BP (!) 145/79 (BP Location: Left Arm, Patient Position: Sitting)   Pulse 87   Temp 98 F (36.7 C) (Oral)   Ht 5\' 6"  (1.676 m)   Wt 167 lb 6.4 oz (75.9 kg)   SpO2 97%   BMI 27.02 kg/m

## 2016-09-09 ENCOUNTER — Ambulatory Visit
Admission: RE | Admit: 2016-09-09 | Discharge: 2016-09-09 | Disposition: A | Discharge status: Home / Self Care | Payer: PPO | Source: Ambulatory Visit | Attending: Radiation Oncology | Admitting: Radiation Oncology

## 2016-09-09 VITALS — BP 145/79 | HR 87 | Temp 98.0°F | Ht 66.0 in | Wt 167.4 lb

## 2016-09-09 DIAGNOSIS — C541 Malignant neoplasm of endometrium: Secondary | ICD-10-CM

## 2016-09-09 DIAGNOSIS — Z510 Encounter for antineoplastic radiation therapy: Secondary | ICD-10-CM | POA: Diagnosis not present

## 2016-09-09 MED ORDER — SODIUM CHLORIDE 0.9 % IJ SOLN
10.0000 mL | Freq: Once | INTRAMUSCULAR | Status: AC
  Administered 2016-09-09: 10 mL via INTRAVENOUS

## 2016-09-09 NOTE — Progress Notes (Signed)
  Radiation Oncology         [phone] ________________________________  Name: Teresa Norris MRN: [id number]  Date: 09/09/2016  DOB: [birth year]  SIMULATION AND TREATMENT PLANNING NOTE    ICD-9-CM ICD-10-CM   1. 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  NARRATIVE:  The patient was brought to the [facility].  Identity was confirmed.  All relevant records and images related to the planned course of therapy were reviewed.  The patient freely provided informed written consent to proceed with treatment after reviewing the details related to the planned course of therapy. The consent form was witnessed and verified by the simulation staff.  Then, the patient was set-up in a stable reproducible  supine position for radiation therapy.  CT images were obtained.  Surface markings were placed.  The CT images were loaded into the planning software.  Then the target and avoidance structures were contoured.  Treatment planning then occurred.  The radiation prescription was entered and confirmed.  Then, I designed and supervised the construction of a total of 1 medically necessary complex treatment devices.  I have requested : Intensity Modulated Radiotherapy (IMRT) is medically necessary for this case for the following reason:  Small bowel sparing..  I have ordered:dose calc.  PLAN:  The patient will receive 45 Gy in 25 fractions directed at the pelvis Followed by a boost to the  vaginal cuff area with iridium 192 as the high-dose-rate source. The patient will receive 3 treatments at 6 gray prescribed to the mucosal surface for a brachytherapy boost dose of 18 gray.  -----------------------------------  Blair Promise, PhD, MD This document serves as a record of services personally performed by Gery Pray, MD. It was created on his behalf by Truddie Hidden, a trained medical scribe. The creation of this record is based on  the scribe's personal observations and the provider's statements to them. This document has been checked and approved by the attending provider.

## 2016-09-10 ENCOUNTER — Telehealth: Payer: Self-pay

## 2016-09-10 NOTE — Telephone Encounter (Signed)
Told Teresa Norris that she has an appointment with Dr. Marko Plume on 10-06-16 at 130 lab, 2 pm visit and then radiation at 320 pm as requested below by Dr. Marko Plume. Teresa Norris verbalized understanding and wrote appointment down on her calendar.

## 2016-09-10 NOTE — Telephone Encounter (Signed)
-----   Message from Gordy Levan, MD sent at 08/18/2016  1:29 PM EDT ----- Needs appointment back with [facility] + lab ~ half way thru radiation, RT not scheduled yet so need to watch for this and coordinate visit if possible She is to see Dr Sondra Come on Oct 4, will set up RT from there  thanks

## 2016-09-16 DIAGNOSIS — Z510 Encounter for antineoplastic radiation therapy: Secondary | ICD-10-CM | POA: Diagnosis not present

## 2016-09-16 DIAGNOSIS — C541 Malignant neoplasm of endometrium: Secondary | ICD-10-CM | POA: Diagnosis not present

## 2016-09-17 DIAGNOSIS — Z23 Encounter for immunization: Secondary | ICD-10-CM | POA: Diagnosis not present

## 2016-09-17 DIAGNOSIS — C541 Malignant neoplasm of endometrium: Secondary | ICD-10-CM | POA: Diagnosis not present

## 2016-09-17 DIAGNOSIS — Z7189 Other specified counseling: Secondary | ICD-10-CM | POA: Diagnosis not present

## 2016-09-18 ENCOUNTER — Ambulatory Visit
Admission: RE | Admit: 2016-09-18 | Discharge: 2016-09-18 | Disposition: A | Discharge status: Home / Self Care | Payer: PPO | Source: Ambulatory Visit | Attending: Radiation Oncology | Admitting: Radiation Oncology

## 2016-09-18 DIAGNOSIS — Z510 Encounter for antineoplastic radiation therapy: Secondary | ICD-10-CM | POA: Diagnosis not present

## 2016-09-18 DIAGNOSIS — C541 Malignant neoplasm of endometrium: Secondary | ICD-10-CM | POA: Diagnosis not present

## 2016-09-19 ENCOUNTER — Ambulatory Visit
Admission: RE | Admit: 2016-09-19 | Discharge: 2016-09-19 | Disposition: A | Discharge status: Home / Self Care | Payer: PPO | Source: Ambulatory Visit | Attending: Radiation Oncology | Admitting: Radiation Oncology

## 2016-09-19 DIAGNOSIS — Z510 Encounter for antineoplastic radiation therapy: Secondary | ICD-10-CM | POA: Diagnosis not present

## 2016-09-19 DIAGNOSIS — C541 Malignant neoplasm of endometrium: Secondary | ICD-10-CM | POA: Diagnosis not present

## 2016-09-22 ENCOUNTER — Ambulatory Visit
Admission: RE | Admit: 2016-09-22 | Discharge: 2016-09-22 | Disposition: A | Discharge status: Home / Self Care | Payer: PPO | Source: Ambulatory Visit | Attending: Radiation Oncology | Admitting: Radiation Oncology

## 2016-09-22 DIAGNOSIS — Z510 Encounter for antineoplastic radiation therapy: Secondary | ICD-10-CM | POA: Diagnosis not present

## 2016-09-22 DIAGNOSIS — C541 Malignant neoplasm of endometrium: Secondary | ICD-10-CM | POA: Diagnosis not present

## 2016-09-23 ENCOUNTER — Ambulatory Visit
Admission: RE | Admit: 2016-09-23 | Discharge: 2016-09-23 | Disposition: A | Discharge status: Home / Self Care | Payer: PPO | Source: Ambulatory Visit | Attending: Radiation Oncology | Admitting: Radiation Oncology

## 2016-09-23 ENCOUNTER — Encounter: Payer: Self-pay | Admitting: Radiation Oncology

## 2016-09-23 VITALS — BP 149/86 | HR 74 | Temp 98.2°F | Ht 66.0 in | Wt 168.4 lb

## 2016-09-23 DIAGNOSIS — C541 Malignant neoplasm of endometrium: Secondary | ICD-10-CM

## 2016-09-23 DIAGNOSIS — Z510 Encounter for antineoplastic radiation therapy: Secondary | ICD-10-CM | POA: Diagnosis not present

## 2016-09-23 NOTE — Progress Notes (Signed)
Pt here for patient teaching.  Pt given Radiation and You booklet. Pt reports they have not watched the Radiation Therapy Education video and has been given the link to watch at home.  Reviewed areas of pertinence such as diarrhea, fatigue, nausea and vomiting, skin changes and urinary and bladder changes . Pt able to give teach back of to pat skin, use unscented/gentle soap, use baby wipes, have Imodium on hand, drink plenty of water and sitz bath,avoid applying anything to skin within 4 hours of treatment. Pt demonstrated understanding and verbalizes understanding of information given and will contact nursing with any questions or concerns.

## 2016-09-23 NOTE — Progress Notes (Signed)
Teresa Norris has completed 4 fractions to her pelvis.  She reports having some discomfort in her rectal area from hemorrhoids.  She uses proctofoam as needed.  She also reports having some aching in her pelvic area at night that has been going on for years.  She said tylenol relieves it.  She denies having any vaginal bleeding.  BP (!) 149/86 (BP Location: Right Arm, Patient Position: Sitting)   Pulse 74   Temp 98.2 F (36.8 C) (Oral)   Ht 5\' 6"  (1.676 m)   Wt 168 lb 6.4 oz (76.4 kg)   SpO2 97%   BMI 27.18 kg/m    Wt Readings from Last 3 Encounters:  09/23/16 168 lb 6.4 oz (76.4 kg)  09/09/16 167 lb 6.4 oz (75.9 kg)  09/03/16 166 lb 9.6 oz (75.6 kg)

## 2016-09-23 NOTE — Progress Notes (Signed)
  Radiation Oncology         [phone] ________________________________  Name: Teresa Norris MRN: [id number]  Date: 09/23/2016  DOB: November 18, 1928  Weekly Radiation Therapy Management    ICD-9-CM ICD-10-CM   1. 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  Current Dose: 7.2 Gy     Planned Dose:  45+ Gy  Narrative . . . . . . . . The patient presents for routine under treatment assessment. completed 4 fractions to her pelvis.  She reports having some discomfort in her rectal area from hemorrhoids.  She uses proctofoam as needed.  She also reports having some aching in her pelvic area at night that has been going on for years.  She said tylenol relieves it.  She denies having any vaginal bleeding. The patient denies any cramping or diarrhea. The patient reports that she is exercising as much as she is physically able to, 3x a week. The patient reports that she is experiencing some fatigue with physical activity, but otherwise she reports that she is doing and feeling very well.                                     The patient is without complaint.                                 Set-up films were reviewed.                                 The chart was checked. Physical Findings. . .  height is 5\' 6"  (1.676 m) and weight is 168 lb 6.4 oz (76.4 kg). Her oral temperature is 98.2 F (36.8 C). Her blood pressure is 149/86 (abnormal) and her pulse is 74. Her oxygen saturation is 97%. . Weight essentially stable.  No significant changes. Lungs are clear to auscultation bilaterally. Heart has regular rate and rhythm. No palpable cervical, supraclavicular, or axillary adenopathy. Abdomen soft, non-tender, normal bowel sounds.  Impression . . . . . . . The patient is tolerating radiation. Plan . . . . . . . . . . . . Continue treatment as planned.  ________________________________   Blair Promise, PhD, MD  This document serves as a record of  services personally performed by Gery Pray, MD. It was created on his behalf by Truddie Hidden, a trained medical scribe. The creation of this record is based on the scribe's personal observations and the provider's statements to them. This document has been checked and approved by the attending provider.

## 2016-09-24 ENCOUNTER — Ambulatory Visit
Admission: RE | Admit: 2016-09-24 | Discharge: 2016-09-24 | Disposition: A | Discharge status: Home / Self Care | Payer: PPO | Source: Ambulatory Visit | Attending: Radiation Oncology | Admitting: Radiation Oncology

## 2016-09-24 DIAGNOSIS — Z510 Encounter for antineoplastic radiation therapy: Secondary | ICD-10-CM | POA: Diagnosis not present

## 2016-09-24 DIAGNOSIS — C541 Malignant neoplasm of endometrium: Secondary | ICD-10-CM | POA: Diagnosis not present

## 2016-09-25 ENCOUNTER — Ambulatory Visit
Admission: RE | Admit: 2016-09-25 | Discharge: 2016-09-25 | Disposition: A | Discharge status: Home / Self Care | Payer: PPO | Source: Ambulatory Visit | Attending: Radiation Oncology | Admitting: Radiation Oncology

## 2016-09-25 DIAGNOSIS — Z510 Encounter for antineoplastic radiation therapy: Secondary | ICD-10-CM | POA: Diagnosis not present

## 2016-09-25 DIAGNOSIS — C541 Malignant neoplasm of endometrium: Secondary | ICD-10-CM | POA: Diagnosis not present

## 2016-09-26 ENCOUNTER — Ambulatory Visit
Admission: RE | Admit: 2016-09-26 | Discharge: 2016-09-26 | Disposition: A | Discharge status: Home / Self Care | Payer: PPO | Source: Ambulatory Visit | Attending: Radiation Oncology | Admitting: Radiation Oncology

## 2016-09-26 DIAGNOSIS — C541 Malignant neoplasm of endometrium: Secondary | ICD-10-CM | POA: Diagnosis not present

## 2016-09-26 DIAGNOSIS — Z510 Encounter for antineoplastic radiation therapy: Secondary | ICD-10-CM | POA: Diagnosis not present

## 2016-09-26 NOTE — Progress Notes (Addendum)
Patient reports having frequent soft bowel movements (3-4 a day) that is irritating her hemorrhoids and making her feel fatigued.  She is trying to follow a low fiber diet.  Advised her to try taking Imodium if the bowel movements become loose and more frequent.  Also advised that she can use the proctofoam she has at home and to use Desitin as needed but to make sure it is wiped off before radiation.  Also provided her with a sitz bath and instructed her on how to use it.

## 2016-09-29 ENCOUNTER — Ambulatory Visit: Payer: PPO

## 2016-09-30 ENCOUNTER — Encounter: Payer: Self-pay | Admitting: Radiation Oncology

## 2016-09-30 ENCOUNTER — Ambulatory Visit
Admission: RE | Admit: 2016-09-30 | Discharge: 2016-09-30 | Disposition: A | Discharge status: Home / Self Care | Payer: PPO | Source: Ambulatory Visit | Attending: Radiation Oncology | Admitting: Radiation Oncology

## 2016-09-30 VITALS — BP 135/79 | HR 88 | Temp 98.0°F | Resp 18 | Ht 66.0 in | Wt 167.8 lb

## 2016-09-30 DIAGNOSIS — C541 Malignant neoplasm of endometrium: Secondary | ICD-10-CM | POA: Insufficient documentation

## 2016-09-30 NOTE — Progress Notes (Signed)
  Radiation Oncology         [phone] ________________________________  Name: Teresa Norris MRN: [id number]  Date: 09/30/2016  DOB: [birth year]  Weekly Radiation Therapy Management    ICD-9-CM ICD-10-CM   1. 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  Current Dose: 14.4 Gy     Planned Dose:  45+ Gy  Narrative . . . . . . . . The patient presents for routine under treatment assessment. completed 4 fractions to her pelvis.  She reports having some discomfort in her rectal area from hemorrhoids.  She uses proctofoam as needed.  She also reports having some aching in her pelvic area at night that has been going on for years.  She said tylenol relieves it.  She denies having any vaginal bleeding. Appetite is good.                                    The patient is without complaint.                                 Set-up films were reviewed.                                 The chart was checked. Physical Findings. . .  height is 5\' 6"  (1.676 m) and weight is 167 lb 12.8 oz (76.1 kg). Her oral temperature is 98 F (36.7 C). Her blood pressure is 135/79 and her pulse is 88. Her respiration is 18 and oxygen saturation is 95%. . Weight essentially stable.  No significant changes. Lungs are clear to auscultation bilaterally. Heart has regular rate and rhythm. No palpable cervical, supraclavicular, or axillary adenopathy. Abdomen soft, non-tender, normal bowel sounds.  Impression . . . . . . . The patient is tolerating radiation. Plan . . . . . . . . . . . . Continue treatment as planned.  ________________________________   Blair Promise, PhD, MD  This document serves as a record of services personally performed by Gery Pray, MD. It was created on his behalf by Bethann Humble, a trained medical scribe. The creation of this record is based on the scribe's personal observations and the provider's statements to them. This document has been  checked and approved by the attending provider. Marland Kitchen

## 2016-09-30 NOTE — Progress Notes (Signed)
Teresa Norris has completed 8th fractions to her pelvis.  She reports having some discomfort in her rectal area from hemorrhoids.  She uses proctofoam as needed. Sill having loose stools not taking her stool softner or laxative medications. She also reports having some aching in her pelvic area at night that has been going on for years occurs weekly.  She said tylenol relieves it.  She denies having any vaginal bleeding.  Appetite is good. Wt Readings from Last 3 Encounters:  09/30/16 167 lb 12.8 oz (76.1 kg)  09/23/16 168 lb 6.4 oz (76.4 kg)  09/09/16 167 lb 6.4 oz (75.9 kg)  BP 135/79   Pulse 88   Temp 98 F (36.7 C) (Oral)   Resp 18   Ht 5\' 6"  (1.676 m)   Wt 167 lb 12.8 oz (76.1 kg)   SpO2 95%   BMI 27.08 kg/m

## 2016-10-01 ENCOUNTER — Ambulatory Visit
Admission: RE | Admit: 2016-10-01 | Discharge: 2016-10-01 | Disposition: A | Discharge status: Home / Self Care | Payer: PPO | Source: Ambulatory Visit | Attending: Radiation Oncology | Admitting: Radiation Oncology

## 2016-10-01 DIAGNOSIS — C541 Malignant neoplasm of endometrium: Secondary | ICD-10-CM | POA: Diagnosis not present

## 2016-10-02 ENCOUNTER — Ambulatory Visit
Admission: RE | Admit: 2016-10-02 | Discharge: 2016-10-02 | Disposition: A | Discharge status: Home / Self Care | Payer: PPO | Source: Ambulatory Visit | Attending: Radiation Oncology | Admitting: Radiation Oncology

## 2016-10-02 DIAGNOSIS — C541 Malignant neoplasm of endometrium: Secondary | ICD-10-CM | POA: Diagnosis not present

## 2016-10-03 ENCOUNTER — Ambulatory Visit
Admission: RE | Admit: 2016-10-03 | Discharge: 2016-10-03 | Disposition: A | Discharge status: Home / Self Care | Payer: PPO | Source: Ambulatory Visit | Attending: Radiation Oncology | Admitting: Radiation Oncology

## 2016-10-03 DIAGNOSIS — C541 Malignant neoplasm of endometrium: Secondary | ICD-10-CM | POA: Diagnosis not present

## 2016-10-05 ENCOUNTER — Other Ambulatory Visit: Payer: Self-pay | Admitting: Oncology

## 2016-10-06 ENCOUNTER — Ambulatory Visit (HOSPITAL_BASED_OUTPATIENT_CLINIC_OR_DEPARTMENT_OTHER): Payer: PPO | Admitting: Oncology

## 2016-10-06 ENCOUNTER — Other Ambulatory Visit (HOSPITAL_BASED_OUTPATIENT_CLINIC_OR_DEPARTMENT_OTHER): Payer: PPO

## 2016-10-06 ENCOUNTER — Ambulatory Visit
Admission: RE | Admit: 2016-10-06 | Discharge: 2016-10-06 | Disposition: A | Discharge status: Home / Self Care | Payer: PPO | Source: Ambulatory Visit | Attending: Radiation Oncology | Admitting: Radiation Oncology

## 2016-10-06 ENCOUNTER — Encounter: Payer: Self-pay | Admitting: Oncology

## 2016-10-06 VITALS — BP 142/70 | HR 80 | Temp 98.6°F | Resp 18 | Ht 66.0 in | Wt 167.0 lb

## 2016-10-06 DIAGNOSIS — M8588 Other specified disorders of bone density and structure, other site: Secondary | ICD-10-CM

## 2016-10-06 DIAGNOSIS — C541 Malignant neoplasm of endometrium: Secondary | ICD-10-CM

## 2016-10-06 DIAGNOSIS — Z5111 Encounter for antineoplastic chemotherapy: Secondary | ICD-10-CM

## 2016-10-06 LAB — CBC WITH DIFFERENTIAL/PLATELET
BASO%: 0.4 % (ref 0.0–2.0)
Basophils Absolute: 0 10*3/uL (ref 0.0–0.1)
EOS%: 8.2 % — AB (ref 0.0–7.0)
Eosinophils Absolute: 0.2 10*3/uL (ref 0.0–0.5)
HEMATOCRIT: 39.3 % (ref 34.8–46.6)
HGB: 12.8 g/dL (ref 11.6–15.9)
LYMPH#: 0.4 10*3/uL — AB (ref 0.9–3.3)
LYMPH%: 11.9 % — AB (ref 14.0–49.7)
MCH: 31 pg (ref 25.1–34.0)
MCHC: 32.7 g/dL (ref 31.5–36.0)
MCV: 94.7 fL (ref 79.5–101.0)
MONO#: 0.3 10*3/uL (ref 0.1–0.9)
MONO%: 9.1 % (ref 0.0–14.0)
NEUT%: 70.4 % (ref 38.4–76.8)
NEUTROS ABS: 2.1 10*3/uL (ref 1.5–6.5)
PLATELETS: 135 10*3/uL — AB (ref 145–400)
RBC: 4.15 10*6/uL (ref 3.70–5.45)
RDW: 14.2 % (ref 11.2–14.5)
WBC: 3 10*3/uL — AB (ref 3.9–10.3)

## 2016-10-06 LAB — COMPREHENSIVE METABOLIC PANEL
ALT: 14 U/L (ref 0–55)
AST: 14 U/L (ref 5–34)
Albumin: 3.6 g/dL (ref 3.5–5.0)
Alkaline Phosphatase: 74 U/L (ref 40–150)
Anion Gap: 8 mEq/L (ref 3–11)
BILIRUBIN TOTAL: 0.45 mg/dL (ref 0.20–1.20)
BUN: 18.7 mg/dL (ref 7.0–26.0)
CHLORIDE: 105 meq/L (ref 98–109)
CO2: 28 meq/L (ref 22–29)
CREATININE: 1 mg/dL (ref 0.6–1.1)
Calcium: 9.1 mg/dL (ref 8.4–10.4)
EGFR: 54 mL/min/{1.73_m2} — ABNORMAL LOW (ref >90)
Glucose: 112 mg/dl (ref 70–140)
Potassium: 4.1 mEq/L (ref 3.5–5.1)
Sodium: 141 mEq/L (ref 136–145)
TOTAL PROTEIN: 6.5 g/dL (ref 6.4–8.3)

## 2016-10-06 NOTE — Progress Notes (Signed)
OFFICE PROGRESS NOTE   October 06, 2016  Ned Card, Christiane Ha, MD, Thurnell Lose, Daneen Schick), Elsie Saas), Floyde Parkins) Physicians:   INTERVAL HISTORY:   Patient is seen, alone for visit, in follow up of adjuvant therapy for IIIC1 grade 2 endometrioid carcinoma. She had initial dose dense carboplatin taxol from 06-26-16 thru day 1 cycle 3 on 08-07-16, then requested to stop chemotherapy. She is now receiving IMRT by Dr Sondra Come, planned thru 10-22-16, with vaginall brachytherapy. After additional discussion today, patient does not want to attempt further adjuvant chemotherapy after radiation.  She saw PCP Dr Felipa Eth recently, following BP.   Patient is tolerating radiation fairly well, much better thus far than she did with chemo. She is having bowel movements ~ 3x daily, not watery, has used occasional imodium. She is fatigued, but not as markedly so as with chemo. She has no N/V now, no abdominal or pelvic discomfort. She reports mild perineal irritation from radiation, has discussed topicals with rad onc. No bleeding, no bladder symptoms, no fever, no SOB, no LE swelling. Hair is beginning to grow back from chemo. No significant peripheral neuropathy residual from chemo. Remainder of 10 point Review of Systems negative / unchanged.   Flu vaccine 09-22-16 No central catheter No genetics testing. MMR testing normal/ no MSI  Granddaughter's bad mitzvah in [place] is ~ 3 weeks after completion of radiation.  ONCOLOGIC HISTORY Patient presented with 2 episodes of vaginal bleeding. She was seen by Dr Simona Huh, with US showing unremarkable uterus and ovaries; endometrial biopsy found adenocarcinoma. She was referred to Dr Josephina Shih on 05-02-16, with robotic assisted total hysterectomy, BSO, SLN biopsy and right pelvic lymphadnectomy by Dr Denman George on 05/06/16. Intraoperative findings were of 12cm fibroid uterus, intramural fibroids, no suspicious extrauterine disease, failed mapping on  right side Pathology ([id number]) revealed a 6cm grade 2 tumor that was 100% invaded through the myometrium (4.3 of 4.3cm) with LVSI present. The adnexa and cervix were negative for tumor, however there were 3 pelvic lymph nodes positive for macrometastatic disease (>54m). MMR testing normal.CT CAP 05-22-16 showed no apparent metastatic disease, with possible 7 mm mucoid impaction in RLL lung. Recommendation for adjuvant therapy with carboplatin taxol given in sandwich fashion with external beam radiation + vaginal brachytherapy. Cycle 1 was delayed a week due to head trauma and bleeding hemorrhoids, day 1 cycle 1 dose dense carbo taxol begun 06-26-16, used thru day 1 cycle 3 on 08-07-16 when patient preferred to DC.    Objective:  Vital signs in last 24 hours:  BP (!) 142/70 (BP Location: Right Arm, Patient Position: Sitting)   Pulse 80   Temp 98.6 F (37 C) (Oral)   Resp 18   Ht 5' 6"  (1.676 m)   Wt 167 lb (75.8 kg)   SpO2 97%   BMI 26.95 kg/m  Weight up 2 lbs Alert, oriented and appropriate. Ambulatory without assistance difficulty.  Partial alopecia  HEENT:PERRL, sclerae not icteric. Oral mucosa moist without lesions, posterior pharynx clear.  Neck supple. No JVD.  Lymphatics:no cervical,supraclavicular or inguinal adenopathy Resp: clear to auscultation bilaterally and normal percussion bilaterally Cardio: regular rate and rhythm. No gallop. GI: soft, nontender, not distended, no mass or organomegaly. Normally active bowel sounds.  Musculoskeletal/ Extremities:LE without pitting edema, cords, tenderness Neuro: no significant  peripheral neuropathy. Otherwise nonfocal. PSYCH brighter mood and affect today Skin without rash, ecchymosis, petechiae   Lab Results:  Results for orders placed or performed in visit on 10/06/16  CBC with Differential  Result Value Ref Range   WBC 3.0 (L) 3.9 - 10.3 10e3/uL   NEUT# 2.1 1.5 - 6.5 10e3/uL   HGB 12.8 11.6 - 15.9 g/dL   HCT 39.3 34.8 -  46.6 %   Platelets 135 (L) 145 - 400 10e3/uL   MCV 94.7 79.5 - 101.0 fL   MCH 31.0 25.1 - 34.0 pg   MCHC 32.7 31.5 - 36.0 g/dL   RBC 4.15 3.70 - 5.45 10e6/uL   RDW 14.2 11.2 - 14.5 %   lymph# 0.4 (L) 0.9 - 3.3 10e3/uL   MONO# 0.3 0.1 - 0.9 10e3/uL   Eosinophils Absolute 0.2 0.0 - 0.5 10e3/uL   Basophils Absolute 0.0 0.0 - 0.1 10e3/uL   NEUT% 70.4 38.4 - 76.8 %   LYMPH% 11.9 (L) 14.0 - 49.7 %   MONO% 9.1 0.0 - 14.0 %   EOS% 8.2 (H) 0.0 - 7.0 %   BASO% 0.4 0.0 - 2.0 %  Comprehensive metabolic panel  Result Value Ref Range   Sodium 141 136 - 145 mEq/L   Potassium 4.1 3.5 - 5.1 mEq/L   Chloride 105 98 - 109 mEq/L   CO2 28 22 - 29 mEq/L   Glucose 112 70 - 140 mg/dl   BUN 18.7 7.0 - 26.0 mg/dL   Creatinine 1.0 0.6 - 1.1 mg/dL   Total Bilirubin 0.45 0.20 - 1.20 mg/dL   Alkaline Phosphatase 74 40 - 150 U/L   AST 14 5 - 34 U/L   ALT 14 0 - 55 U/L   Total Protein 6.5 6.4 - 8.3 g/dL   Albumin 3.6 3.5 - 5.0 g/dL   Calcium 9.1 8.4 - 10.4 mg/dL   Anion Gap 8 3 - 11 mEq/L   EGFR 54 (L) >90 ml/min/1.73 m2     Studies/Results:  No results found.  Medications: I have reviewed the patient's current medications.  DISCUSSION Patient tells me again that she does not want to do any further chemotherapy after radiation completes. WBC and platelets slightly lower consistent with radiation to pelvis, but not in neutropenic range and should recover again out from radiation. I will let gyn oncology know, to coordinate their follow up and possibly scans.   I have not set up return to medical oncology now, tho she certainly can be seen back if requested.  Assessment/Plan: 1.IIIC1 grade 2 endometrioid endometrial carcinoma in 82 yo lady: post robotic assisted hysterectomy BSO left sentinel and right pelvic lymphadnectomy 05-06-16. Recommendation was adjuvant chemotherapy and radiation in sandwich fashion, however patient did not tolerate chemo despite all attempts and requested stopping this after  day 1 cycle 3 on 08-07-16. Radiation continuing, which she is tolerating reasonably well. Plan follow up with radiation oncology and gyn oncology as above.  2 Epistaxis and bleeding apparently from hemorrhoids: resolved 3.Flu vaccine 09-22-16 4.post thyroid ablation remotely, on replacement by Dr Felipa Eth 5.post cataract extractions with lens implants 6.advance directives in place, copy requested for this EMR 7.osteopenia per EMR 8. In living facility with husband and good assistance. Family very attentive.  9.polio age 54 affecting LLE  10. BP variable, Dr Felipa Eth managing 68.difficult peripheral IV access: if needs additional chemo in future, likely would do best with PAC. 12.slight leukopenia and thrombocytopenia likely now related to pelvic radiation, expect will again improve out from that treatment   All questions answered and patient is in agreement with plans above. Time spent 20 min including >50% counseling and coordination of care. CC Drs Denman George, Sondra Come, [place]  Evlyn Clines, MD   10/06/2016, 7:14 PM

## 2016-10-07 ENCOUNTER — Encounter: Payer: Self-pay | Admitting: Radiation Oncology

## 2016-10-07 ENCOUNTER — Encounter: Payer: Self-pay | Admitting: Oncology

## 2016-10-07 ENCOUNTER — Ambulatory Visit
Admission: RE | Admit: 2016-10-07 | Discharge: 2016-10-07 | Disposition: A | Discharge status: Home / Self Care | Payer: PPO | Source: Ambulatory Visit | Attending: Radiation Oncology | Admitting: Radiation Oncology

## 2016-10-07 VITALS — BP 137/85 | HR 80 | Temp 98.1°F | Ht 66.0 in | Wt 167.0 lb

## 2016-10-07 DIAGNOSIS — C541 Malignant neoplasm of endometrium: Secondary | ICD-10-CM

## 2016-10-07 NOTE — Progress Notes (Signed)
[facility] END OF TREATMENT   Name: JENTRY KRISTOFF Date: October 07, 2016  MRN: [id number] DOB: Sep 10, 1928   TREATMENT DATES: 06-26-16 thru 08-07-16   REFERRING PHYSICIAN: Everitt Amber  DIAGNOSIS: grade 2 endometrioid endometrial carcinoma   STAGE AT START OF TREATMENT: IIIC1   INTENT: curative   DRUGS OR REGIMENS GIVEN: dose dense carboplatin taxol   MAJOR TOXICITIES: fatigue, nausea   REASON TREATMENT STOPPED: patient elected to discontinue chemotherapy   PERFORMANCE STATUS AT END: 1   ONGOING PROBLEMS: fatigue   FOLLOW UP PLANS: radiation therapy, follow up gyn oncology and radiation oncology

## 2016-10-07 NOTE — Progress Notes (Signed)
Teresa Norris has completed 13 fractions to her pelvis.  She denies having pain and reports she is feeling good today.  She reports having 3 bowel movements on average per day which tire her out.  She took an imodium tablet and has not had a bowel movement per day.  She also reports having occasional small amount of rectal bleeding.  She denies having any bladder issues or skin irritation.    BP 137/85 (BP Location: Right Arm, Patient Position: Sitting)   Pulse 80   Temp 98.1 F (36.7 C) (Oral)   Ht 5\' 6"  (1.676 m)   Wt 167 lb (75.8 kg)   SpO2 98%   BMI 26.95 kg/m    Wt Readings from Last 3 Encounters:  10/07/16 167 lb (75.8 kg)  10/06/16 167 lb (75.8 kg)  09/30/16 167 lb 12.8 oz (76.1 kg)

## 2016-10-07 NOTE — Progress Notes (Signed)
  Radiation Oncology         (336) [phone] ________________________________  Name: Teresa Norris MRN: [id number]  Date: 10/07/2016  DOB: 05-05-1928  Weekly Radiation Therapy Management    ICD-9-CM ICD-10-CM   1. 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  Current Dose: 23.4 Gy     Planned Dose:  45+ Gy  Narrative . . . . . . . . The patient presents for routine under treatment assessment.  Teresa Norris has completed 13 fractions to her pelvis.  She denies having pain and reports she is feeling good today.  She reports having 3 bowel movements on average per day which tire her out.  She took an imodium tablet and it has helped.  She also reports having occasional small amount of rectal bleeding.  She denies having any bladder issues or skin irritation.  She saw Dr. Marko Plume yesterday.                                  Set-up films were reviewed.                                 The chart was checked. Physical Findings. . .  height is 5\' 6"  (1.676 m) and weight is 167 lb (75.8 kg). Her oral temperature is 98.1 F (36.7 C). Her blood pressure is 137/85 and her pulse is 80. Her oxygen saturation is 98%. . Weight essentially stable.  No significant changes. Lungs are clear to auscultation bilaterally. Heart has regular rate and rhythm. Abdomen soft, non-tender, normal bowel sounds. Impression . . . . . . . The patient is tolerating radiation. Plan . . . . . . . . . . . . Continue treatment as planned. ________________________________   Blair Promise, PhD, MD  This document serves as a record of services personally performed by Gery Pray, MD. It was created on his behalf by Darcus Austin, a trained medical scribe. The creation of this record is based on the scribe's personal observations and the provider's statements to them. This document has been checked and approved by the attending provider.

## 2016-10-08 ENCOUNTER — Ambulatory Visit
Admission: RE | Admit: 2016-10-08 | Discharge: 2016-10-08 | Disposition: A | Discharge status: Home / Self Care | Payer: PPO | Source: Ambulatory Visit | Attending: Radiation Oncology | Admitting: Radiation Oncology

## 2016-10-08 DIAGNOSIS — C541 Malignant neoplasm of endometrium: Secondary | ICD-10-CM | POA: Diagnosis not present

## 2016-10-09 ENCOUNTER — Ambulatory Visit
Admission: RE | Admit: 2016-10-09 | Discharge: 2016-10-09 | Disposition: A | Discharge status: Home / Self Care | Payer: PPO | Source: Ambulatory Visit | Attending: Radiation Oncology | Admitting: Radiation Oncology

## 2016-10-09 DIAGNOSIS — C541 Malignant neoplasm of endometrium: Secondary | ICD-10-CM | POA: Diagnosis not present

## 2016-10-10 ENCOUNTER — Encounter: Payer: Self-pay | Admitting: Oncology

## 2016-10-10 ENCOUNTER — Ambulatory Visit
Admission: RE | Admit: 2016-10-10 | Discharge: 2016-10-10 | Disposition: A | Discharge status: Home / Self Care | Payer: PPO | Source: Ambulatory Visit | Attending: Radiation Oncology | Admitting: Radiation Oncology

## 2016-10-10 DIAGNOSIS — C541 Malignant neoplasm of endometrium: Secondary | ICD-10-CM | POA: Diagnosis not present

## 2016-10-10 NOTE — Progress Notes (Signed)
Medical Oncology  Message sent to gyn oncology to coordinate follow up. Per gyn onc, patient will see Dr Denman George on 11-03-16 , does not need scans prior to that visit.  Godfrey Pick, MD

## 2016-10-13 ENCOUNTER — Ambulatory Visit
Admission: RE | Admit: 2016-10-13 | Discharge: 2016-10-13 | Disposition: A | Discharge status: Home / Self Care | Payer: PPO | Source: Ambulatory Visit | Attending: Radiation Oncology | Admitting: Radiation Oncology

## 2016-10-13 DIAGNOSIS — C541 Malignant neoplasm of endometrium: Secondary | ICD-10-CM | POA: Diagnosis not present

## 2016-10-14 ENCOUNTER — Encounter: Payer: Self-pay | Admitting: Radiation Oncology

## 2016-10-14 ENCOUNTER — Ambulatory Visit
Admission: RE | Admit: 2016-10-14 | Discharge: 2016-10-14 | Disposition: A | Discharge status: Home / Self Care | Payer: PPO | Source: Ambulatory Visit | Attending: Radiation Oncology | Admitting: Radiation Oncology

## 2016-10-14 VITALS — BP 148/87 | HR 79 | Temp 98.2°F | Ht 66.0 in | Wt 165.4 lb

## 2016-10-14 DIAGNOSIS — C775 Secondary and unspecified malignant neoplasm of intrapelvic lymph nodes: Secondary | ICD-10-CM

## 2016-10-14 DIAGNOSIS — C541 Malignant neoplasm of endometrium: Secondary | ICD-10-CM

## 2016-10-14 NOTE — Progress Notes (Addendum)
Kelah has completed 18 fractions to her pelvis.  She denies having pain today other than arthritis in her left knee.  She reports feeling very tired yesterday but is feeling better today.  She reports having between 1-3 bowel movements per day and takes Imodium as needed.  She denies having any bladder issues, skin irritation and vaginal bleeding.  She reports having a small amount of rectal bleeding yesterday after treatment.  BP (!) 148/87 (BP Location: Right Arm, Patient Position: Sitting)   Pulse 79   Temp 98.2 F (36.8 C) (Oral)   Ht 5\' 6"  (1.676 m)   Wt 165 lb 6.4 oz (75 kg)   SpO2 98%   BMI 26.70 kg/m    Wt Readings from Last 3 Encounters:  10/14/16 165 lb 6.4 oz (75 kg)  10/07/16 167 lb (75.8 kg)  10/06/16 167 lb (75.8 kg)

## 2016-10-14 NOTE — Progress Notes (Signed)
  Radiation Oncology         [phone] ________________________________  Name: Teresa Norris MRN: [id number]  Date: 10/14/2016  DOB: November 10, 1928  Weekly Radiation Therapy Management    ICD-9-CM ICD-10-CM   1. 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 . . . . . . . The patient presents for routine under treatment assessment.                                   Somtochukwu has completed 18 fractions to her pelvis.  She denies having pain today other than arthritis in her left knee.  She reports feeling very tired yesterday but is feeling better today.  She reports having between 1-3 bowel movements per day and takes Imodium as needed.  She denies having any bladder issues, skin irritation and vaginal bleeding.  She reports having a small amount of rectal bleeding yesterday after treatment.                                 Set-up films were reviewed.                                 The chart was checked. Physical Findings. . .  height is 5\' 6"  (1.676 m) and weight is 165 lb 6.4 oz (75 kg). Her oral temperature is 98.2 F (36.8 C). Her blood pressure is 148/87 (abnormal) and her pulse is 79. Her oxygen saturation is 98%. . Weight essentially stable.  Lungs are clear to auscultation bilaterally. Heart has regular rate and rhythm. No palpable cervical, supraclavicular, or axillary adenopathy. Abdomen soft, non-tender, normal bowel sounds.  Impression . . . . . . . The patient is tolerating radiation. Plan . . . . . . . . . . . . Continue treatment as planned.  ________________________________   Blair Promise, PhD, MD

## 2016-10-15 ENCOUNTER — Telehealth: Payer: Self-pay | Admitting: *Deleted

## 2016-10-15 ENCOUNTER — Ambulatory Visit
Admission: RE | Admit: 2016-10-15 | Discharge: 2016-10-15 | Disposition: A | Discharge status: Home / Self Care | Payer: PPO | Source: Ambulatory Visit | Attending: Radiation Oncology | Admitting: Radiation Oncology

## 2016-10-15 DIAGNOSIS — C541 Malignant neoplasm of endometrium: Secondary | ICD-10-CM | POA: Diagnosis not present

## 2016-10-15 NOTE — Telephone Encounter (Signed)
Called patient to inform of new HDR Vag. Cuff Case for Dec. 21 and two additional txs. On Jan. 3 and Jan. 10, spoke with patient and she is aware of these appts.

## 2016-10-16 ENCOUNTER — Ambulatory Visit
Admission: RE | Admit: 2016-10-16 | Discharge: 2016-10-16 | Disposition: A | Discharge status: Home / Self Care | Payer: PPO | Source: Ambulatory Visit | Attending: Radiation Oncology | Admitting: Radiation Oncology

## 2016-10-16 DIAGNOSIS — C541 Malignant neoplasm of endometrium: Secondary | ICD-10-CM | POA: Diagnosis not present

## 2016-10-17 ENCOUNTER — Ambulatory Visit
Admission: RE | Admit: 2016-10-17 | Discharge: 2016-10-17 | Disposition: A | Discharge status: Home / Self Care | Payer: PPO | Source: Ambulatory Visit | Attending: Radiation Oncology | Admitting: Radiation Oncology

## 2016-10-17 DIAGNOSIS — C541 Malignant neoplasm of endometrium: Secondary | ICD-10-CM | POA: Diagnosis not present

## 2016-10-19 ENCOUNTER — Ambulatory Visit
Admission: RE | Admit: 2016-10-19 | Discharge: 2016-10-19 | Disposition: A | Discharge status: Home / Self Care | Payer: PPO | Source: Ambulatory Visit | Attending: Radiation Oncology | Admitting: Radiation Oncology

## 2016-10-19 DIAGNOSIS — C541 Malignant neoplasm of endometrium: Secondary | ICD-10-CM | POA: Diagnosis not present

## 2016-10-20 ENCOUNTER — Ambulatory Visit
Admission: RE | Admit: 2016-10-20 | Discharge: 2016-10-20 | Disposition: A | Discharge status: Home / Self Care | Payer: PPO | Source: Ambulatory Visit | Attending: Radiation Oncology | Admitting: Radiation Oncology

## 2016-10-20 DIAGNOSIS — C541 Malignant neoplasm of endometrium: Secondary | ICD-10-CM | POA: Diagnosis not present

## 2016-10-21 ENCOUNTER — Ambulatory Visit
Admission: RE | Admit: 2016-10-21 | Discharge: 2016-10-21 | Disposition: A | Discharge status: Home / Self Care | Payer: PPO | Source: Ambulatory Visit | Attending: Radiation Oncology | Admitting: Radiation Oncology

## 2016-10-21 ENCOUNTER — Encounter: Payer: Self-pay | Admitting: Radiation Oncology

## 2016-10-21 VITALS — BP 158/93 | HR 72 | Temp 97.7°F | Ht 66.0 in | Wt 162.4 lb

## 2016-10-21 DIAGNOSIS — C775 Secondary and unspecified malignant neoplasm of intrapelvic lymph nodes: Secondary | ICD-10-CM

## 2016-10-21 DIAGNOSIS — C541 Malignant neoplasm of endometrium: Secondary | ICD-10-CM | POA: Diagnosis not present

## 2016-10-21 NOTE — Progress Notes (Signed)
  Radiation Oncology         [phone] ________________________________  Name: [name] MRN: [id number]  Date: 10/21/2016  DOB: 11/13/1928  End of Treatment Note  Diagnosis:   Stage IIIC1 grade 2 endometrioid endometrial carcinoma  Indication for treatment:  Curative       Radiation treatment dates:   09/18/16 - 10/21/16  Site/dose:   The pelvis was treated to 43.2 Gy in 24 fractions of 1.8 Gy/fx  Beams/energy:   IMRT // 6X  Narrative: The patient tolerated radiation treatment relatively well. She reported some urinary frequency, as well as one episode of diarrhea over the course of her treatment. She also reported feeling very fatigued. She elected to end her treatment one day early.  Plan: The patient has completed radiation treatment. The patient will return to radiation oncology clinic for her planned brachytherapy procedure in one month. She did not wish to proceed earlier with this treatment. I advised her to call or return sooner if she has any questions or concerns related to her recovery or treatment.  -----------------------------------  Blair Promise, PhD, MD  This document serves as a record of services personally performed by Gery Pray, MD. It was created on his behalf by Maryla Morrow, a trained medical scribe. The creation of this record is based on the scribe's personal observations and the provider's statements to them. This document has been checked and approved by the attending provider.

## 2016-10-21 NOTE — Progress Notes (Signed)
  Radiation Oncology         [phone] ________________________________  Name: Teresa Norris MRN: [id number]  Date: 10/21/2016  DOB: 01-08-1928  Weekly Radiation Therapy Management    ICD-9-CM ICD-10-CM   1. Endometrial ca (HCC) 182.0 C54.1   2. 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 . . . . . . . The patient presents for routine under treatment assessment.  The patient has completed 24 fractions to her pelvis. She reports having pain in her right big toe that started on Saturday night (10/18/16); she is applying neosporin to it. She reports she has an appointment with her podiatrist, Dr. Prudence Davidson, tomorrow. The patient reports urinary frequency. She reports she had diarrhea for the first time today, and took 2 Imodium. The patient reports a small amount of rectal bleeding from hemorrhoids. She reports feeling very fatigued. She also says that she wants today to be her last treatment. The patient questions if she really needs to go through with the high dose internal treatment.                                  Set-up films were reviewed.                                 The chart was checked. Physical Findings. . .  height is 5\' 6"  (1.676 m) and weight is 162 lb 6.4 oz (73.7 kg). Her oral temperature is 97.7 F (36.5 C). Her blood pressure is 158/93 (abnormal) and her pulse is 72. Her oxygen saturation is 98%. . Weight essentially stable.  Lungs are clear to auscultation bilaterally. Heart has regular rate and rhythm. No palpable cervical, supraclavicular, or axillary adenopathy. Abdomen soft, non-tender, normal bowel sounds.  Impression . . . . . . . The patient is tolerating radiation. Plan . . . . . . . . . . . . The patient decided  not continue with her final treatment tomorrow. I encouraged the patient to move forward with the internal procedure, especially since she is ending external beam  radiotherapy early, in order to give her the best outcome. I also advised the patient that with radiation ending her bowel function should return to normal soon, but to be aware of the foods she eats in the next several days. The patient will return in  ~ one month for brachytherapy. She knows to call me with any questions or concerns in the interim. ________________________________   Blair Promise, PhD, MD  This document serves as a record of services personally performed by Gery Pray, MD. It was created on his behalf by Maryla Morrow, a trained medical scribe. The creation of this record is based on the scribe's personal observations and the provider's statements to them. This document has been checked and approved by the attending provider.

## 2016-10-21 NOTE — Progress Notes (Signed)
Teresa Norris has completed 24 fractions to her pelvis.  She reports having pain in her right big toe that started on Saturday.  The toe is red and she is applying neosporin to it.  She has an appointment with Dr. Prudence Davidson ([place]) tomorrow.  She reports having urinary frequency.  She also had diarrhea for the first time today.  She took 2 Imodium and it stopped.  She reports having a small amount of rectal bleeding from hemorrhoids.  She reports feeling very fatigued.  She said that she wants today to be her last treatment.    BP (!) 172/96 (BP Location: Right Arm, Patient Position: Sitting)   Pulse 75   Temp 97.7 F (36.5 C) (Oral)   Ht 5\' 6"  (1.676 m)   Wt 162 lb 6.4 oz (73.7 kg)   SpO2 98%   BMI 26.21 kg/m    Wt Readings from Last 3 Encounters:  10/21/16 162 lb 6.4 oz (73.7 kg)  10/14/16 165 lb 6.4 oz (75 kg)  10/07/16 167 lb (75.8 kg)

## 2016-10-22 ENCOUNTER — Telehealth: Payer: Self-pay | Admitting: Podiatry

## 2016-10-22 ENCOUNTER — Ambulatory Visit (INDEPENDENT_AMBULATORY_CARE_PROVIDER_SITE_OTHER): Payer: PPO | Admitting: Podiatry

## 2016-10-22 ENCOUNTER — Encounter: Payer: Self-pay | Admitting: Podiatry

## 2016-10-22 ENCOUNTER — Ambulatory Visit: Payer: PPO

## 2016-10-22 VITALS — Ht 66.0 in | Wt 166.0 lb

## 2016-10-22 DIAGNOSIS — L03031 Cellulitis of right toe: Secondary | ICD-10-CM | POA: Diagnosis not present

## 2016-10-22 MED ORDER — CEPHALEXIN 500 MG PO CAPS: 500.0000 mg | ORAL_CAPSULE | Freq: Two times a day (BID) | ORAL | 0 refills | Status: DC

## 2016-10-22 NOTE — Telephone Encounter (Signed)
Please see outgoing call note.

## 2016-10-22 NOTE — Progress Notes (Signed)
Complaint:  Visit Type: Patient returns to my office for continued preventative foot care services. Complaint: Patient states" my nails have grown long and thick.  Her big toe right foot has become red and swollen and painful when wearing shoes. She says the toe became painful on Saturday and has been worsening.  She presents for preventive footcare services.  Podiatric Exam: Vascular: dorsalis pedis and posterior tibial pulses are palpable bilateral. Capillary return is immediate. Temperature gradient is WNL. Skin turgor WNL  Sensorium: Normal Semmes Weinstein monofilament test. Normal tactile sensation bilaterally. Nail Exam: Pt has thick disfigured discolored nails with subungual debris noted bilateral entire nail hallux through fifth toenails Ulcer Exam: There is no evidence of ulcer or pre-ulcerative changes or infection. Orthopedic Exam: Muscle tone and strength are WNL. No limitations in general ROM. No crepitus or effusions noted. Foot type and digits show no abnormalities. Bony prominences are unremarkable. Skin: No Porokeratosis. No infection or ulcers  Diagnosis: Paronychia right hallux toenail.  Treatment & Plan Procedures and Treatment: Consent by patient was obtained for treatment procedures. The patient understood the discussion of treatment and procedures well. All questions were answered thoroughly reviewed. She was diagnosed with an abcess subungually  on the right hallux. Patient is under cancer treatment and has just finished a round of treatment and is not well.  After discussing the preferred treatment which is the removal of the toenail completely. I performed only an incision and drainage and put her on home soaks and prescribed cephalexin to be taken by mouth. If this problem worsens. She is to call the office. She is also to given an appointment for one week for reevaluation and if the nail is still symptomatic next week. I will then consider the removal of the nail at that  time   Gardiner Barefoot DPM Return Visit-Office Procedure: Patient instructed to return to the office for a follow up visit 3 months for continued evaluation and treatment.    Gardiner Barefoot DPM

## 2016-10-24 ENCOUNTER — Ambulatory Visit: Payer: PPO

## 2016-10-29 DIAGNOSIS — B351 Tinea unguium: Secondary | ICD-10-CM | POA: Diagnosis not present

## 2016-10-30 ENCOUNTER — Encounter: Payer: Self-pay | Admitting: Radiation Oncology

## 2016-10-30 ENCOUNTER — Ambulatory Visit: Payer: PPO | Admitting: Podiatry

## 2016-11-03 ENCOUNTER — Encounter: Payer: Self-pay | Admitting: Gynecologic Oncology

## 2016-11-03 ENCOUNTER — Ambulatory Visit: Payer: PPO | Attending: Gynecologic Oncology | Admitting: Gynecologic Oncology

## 2016-11-03 VITALS — BP 142/71 | HR 86 | Temp 98.0°F | Resp 18 | Ht 66.0 in | Wt 164.8 lb

## 2016-11-03 DIAGNOSIS — H269 Unspecified cataract: Secondary | ICD-10-CM | POA: Insufficient documentation

## 2016-11-03 DIAGNOSIS — Z9889 Other specified postprocedural states: Secondary | ICD-10-CM | POA: Diagnosis not present

## 2016-11-03 DIAGNOSIS — Z8612 Personal history of poliomyelitis: Secondary | ICD-10-CM | POA: Diagnosis not present

## 2016-11-03 DIAGNOSIS — C541 Malignant neoplasm of endometrium: Secondary | ICD-10-CM

## 2016-11-03 DIAGNOSIS — E039 Hypothyroidism, unspecified: Secondary | ICD-10-CM | POA: Diagnosis not present

## 2016-11-03 DIAGNOSIS — F419 Anxiety disorder, unspecified: Secondary | ICD-10-CM | POA: Insufficient documentation

## 2016-11-03 DIAGNOSIS — Z90710 Acquired absence of both cervix and uterus: Secondary | ICD-10-CM | POA: Insufficient documentation

## 2016-11-03 NOTE — Progress Notes (Signed)
Follow-up Note: Gyn-Onc   Teresa Norris 82 y.o. female  Chief Complaint  Patient presents with  . endmetrial cancer    Assessment : 82 year old woman with stage IIIC1 grade 2 endometrioid endometrial cancer. S/p adjuvant therapy (3 cycles chemotherapy and external beam pelvic RT).  Plan:   I discussed that her treatment was abbreviated and therefore potentially less effective, however there is no alternative therapy that I would expect to be efficacious at this time. We will enter a period of surveillance. She has declined additional vaginal brachytherapy.  I am recommending follow-up with me in 3 months. She will need re-imaging of her chest to re-evaluate a lung lesion seen preoperatively.   HPI: 82 year old white female seen in consultation request of Dr. Simona Huh regarding management of a newly diagnosed endometrial carcinoma. Proctoscopy 6 weeks ago the patient had her first onset of abnormal bleeding and 40 years. This abated but approximately a month later she began bleeding again. She was then seen by Dr.Varnado who obtained an ultrasound showing a normal uterus and ovaries. Endometrial biopsy was interpreted as adenocarcinoma. The patient continued to have some light bleeding. She denies any pelvic pain.  She has a past history of polio as a child and she claims that she has prolonged slow recovery following anesthesia.  The patient underwent robotic assisted total hysterectomy, BSO, SLN biopsy on 05/22/16. The surgery was uneventful. Pathology revealed a 6cm grade 2 tumor that was 100% invaded through the myometrium (4.3 of 4.3cm) with LVSI present. The adnexa and cervix were negative for tumor, however there were 3 pelvic lymph nodes positive for macrometastatic disease (>1mm).  Postoperative CT scan on 05/22/16 showed no evidence for distant metastases. It did show a 105mm right lower lobe indeterminate pulmonary nodules with probable mucoid impaction of several terminal  bronchioles. Recommended follow-up in 6 months.  Interval Hx:  She had initial dose dense carboplatin taxol from 06-26-16 thru day 1 cycle 3 on 08-07-16, then requested to stop chemotherapy due to bone marrow toxicity and fatigue.  She received 43 Gy external beam radiotherapy with IMRT with Dr Sondra Come between 10/19 and 10/21/16.   Brachytherapy is scheduled for later this month however the patient does not desire additional therapy.  Review of Systems:10 point review of systems is negative except as noted in interval history.   Vitals: Blood pressure (!) 142/71, pulse 86, temperature 98 F (36.7 C), temperature source Oral, resp. rate 18, height 5\' 6"  (1.676 m), weight 164 lb 12.8 oz (74.8 kg), SpO2 98 %.  Physical Exam: General : The patient is a healthy woman in no acute distress.  HEENT: normocephalic, extraoccular movements normal; neck is supple without thyromegally  Lynphnodes: Supraclavicular and inguinal nodes not enlarged  Abdomen: Soft, non-tender, no ascites, no organomegally, no masses, no hernias Well healed incisions. Pelvic:  EGBUS: Normal female  Vagina: Vaginal cuff healing well and in tact. Urethra and Bladder: Normal, non-tender  Bi-manual examination: Non-tender; no adenxal masses or nodularity  Rectal: normal sphincter tone, no masses, no blood  Lower extremities: No edema or varicosities. Normal range of motion      Allergies  Allergen Reactions  . Aspirin Anaphylaxis and Other (See Comments)    STOMACH PAIN/BLEEDING ULCERS  . Advil [Ibuprofen]   . E-Mycin [Erythromycin] Other (See Comments)    Stomach problems    Past Medical History:  Diagnosis Date  . Anginal pain (HCC)    was not cardiac related  . Anxiety   . Arthritis   .  Cancer [facility])    endometrial cancer  . Cataract    Both eyes  . Complication of anesthesia    states that anesthesia makes her groggy for about six weeks after surgery.  Hx. of polio   . Costochondritis   . Hypothyroid   .  Muscle fatigue    Post polio  . Parasomnia    Night terrors  . Polio    At age 82 or 5 after a tonsillectomy    Past Surgical History:  Procedure Laterality Date  . ABDOMINAL HYSTERECTOMY    . BREAST SURGERY     BENIGN NODES  . CATARACT EXTRACTION    . EYE SURGERY Bilateral Lt 11/12   Rt 12/12   CATARACT  . 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 FOOT SURGERY  1938  . LEG SURGERY Left 1988  . LYMPH NODE BIOPSY N/A 05/06/2016   Procedure: Sentinel LYMPH NODE BIOPSY right total lymphandenapathy;  Surgeon: Everitt Amber, MD;  Location: [facility];  Service: Gynecology;  Laterality: N/A;  . ROBOTIC ASSISTED TOTAL HYSTERECTOMY WITH BILATERAL SALPINGO OOPHERECTOMY N/A 05/06/2016   Procedure: XI ROBOTIC ASSISTED TOTAL HYSTERECTOMY WITH BILATERAL SALPINGO OOPHORECTOMY;  Surgeon: Everitt Amber, MD;  Location: [facility];  Service: Gynecology;  Laterality: N/A;  . TONSILLECTOMY      Current Outpatient Prescriptions  Medication Sig Dispense Refill  . acetaminophen (TYLENOL) 500 MG tablet Take 500-1,000 mg by mouth at bedtime as needed for mild pain.     . cephALEXin (KEFLEX) 500 MG capsule Take 1 capsule (500 mg total) by mouth 2 (two) times daily. 20 capsule 0  . clonazePAM (KLONOPIN) 0.5 MG tablet TAKE 1 TABLET BY MOUTH AT NIGHT 90 tablet 0  . docusate sodium (COLACE) 100 MG capsule Take 1 capsule (100 mg total) by mouth 2 (two) times daily. 30 capsule 0  . hydrocortisone (PROCTO-MED HC) 2.5 % rectal cream Place 1 application rectally daily as needed for hemorrhoids or itching (for internal hemorrhoids). 30 g 1  . levothyroxine (SYNTHROID, LEVOTHROID) 100 MCG tablet Take 100 mcg by mouth daily before breakfast.    . loperamide (IMODIUM) 2 MG capsule Take by mouth as needed for diarrhea or loose stools.    . polyvinyl alcohol (LIQUIFILM TEARS) 1.4 % ophthalmic solution Place 2 drops into both eyes daily.     Marland Kitchen zolpidem (AMBIEN) 5 MG tablet Take 5 mg by mouth at bedtime.      No current  facility-administered medications for this visit.     Social History   Social History  . Marital status: Married    Spouse name: [name]  . Number of children: 3  . Years of education: 14   Occupational History  . Not on file.   Social History Main Topics  . Smoking status: Never Smoker  . Smokeless tobacco: Never Used  . Alcohol use Not on file     Comment: WINE   . Drug use: Unknown  . Sexual activity: Not on file   Other Topics Concern  . Not on file   Social History Narrative   Patient is married [name]) and lives at home with her husband.   Patient has three children.   Patient is retired.   Patient has a college education.   Patient drinks caffeine once in a while.    Family History  Problem Relation Age of Onset  . Parkinson's disease Father   . Acute myelogenous leukemia Brother   . Acute myelogenous  leukemia       30 minutes of direct face to face counseling time was spent with the patient.   Donaciano Eva, MD 11/03/2016, 3:59 PM

## 2016-11-03 NOTE — Patient Instructions (Signed)
We spoke with Dr. Sondra Come about cancelling your treatments.  Plan to follow up with Dr. Denman George in three months.

## 2016-11-05 ENCOUNTER — Ambulatory Visit (INDEPENDENT_AMBULATORY_CARE_PROVIDER_SITE_OTHER): Payer: PPO | Admitting: Podiatry

## 2016-11-05 DIAGNOSIS — B351 Tinea unguium: Secondary | ICD-10-CM

## 2016-11-05 DIAGNOSIS — L603 Nail dystrophy: Secondary | ICD-10-CM | POA: Diagnosis not present

## 2016-11-05 DIAGNOSIS — L608 Other nail disorders: Secondary | ICD-10-CM | POA: Diagnosis not present

## 2016-11-05 DIAGNOSIS — M79609 Pain in unspecified limb: Secondary | ICD-10-CM

## 2016-11-06 NOTE — Progress Notes (Signed)
SUBJECTIVE Patient  presents to office today complaining of elongated, thickened nails. Pain while ambulating in shoes. Patient is unable to trim their own nails.  Patient also presents with a complaint of a partially detached nail to the right great toe. Patient denies trauma. Patient believes she had an infection to the right great toenail.  OBJECTIVE General Patient is awake, alert, and oriented x 3 and in no acute distress. Derm 95% detached toenail noted to the right great toe. Skin is dry and supple bilateral. Negative open lesions or macerations. Remaining integument unremarkable. Nails are tender, long, thickened and dystrophic with subungual debris, consistent with onychomycosis, 1-5 bilateral. No signs of infection noted. Vasc  DP and PT pedal pulses palpable bilaterally. Temperature gradient within normal limits.  Neuro Epicritic and protective threshold sensation diminished bilaterally.  Musculoskeletal Exam No symptomatic pedal deformities noted bilateral. Muscular strength within normal limits.  ASSESSMENT 1. Onychodystrophic nails 1-5 bilateral with hyperkeratosis of nails.  2. Onychomycosis of nail due to dermatophyte bilateral 3. Pain in foot bilateral 4. Partially detached right great toenail  PLAN OF CARE 1. Patient evaluated today.  2. Instructed to maintain good pedal hygiene and foot care.  3. Mechanical debridement of nails 1-5 bilaterally performed using a nail nipper. Filed with dremel without incident.  4. Right great toenail was removed in toto. Nail bed was cleansed and antibiotic ointment and Band-Aid applied.  5. Explained in detail the patient that she no longer has an infection to the right great toe. We also discussed nail growth. It may take up to a year for the toenail to fully grow back. 6. Return to clinic when necessary   Greater than 15 minutes were spent with the patient   Edrick Kins, DPM

## 2016-11-12 ENCOUNTER — Telehealth: Payer: Self-pay | Admitting: Podiatry

## 2016-11-12 NOTE — Telephone Encounter (Signed)
Patient called to say she had her right great toenail removed by Dr. Amalia Hailey on Wednesday 06 December and she has a red streak on the nail bed about half way down. She said it is not tender/painful to the touch or oozing pus. Stated she has been putting anti bacterial ointment on the toe every evening night. Patient requested you to call her home number first and if she does not answer you can call her mobile number.

## 2016-11-13 NOTE — Telephone Encounter (Signed)
I spoke with pt and she has been putting peroxide on the area, and had already stopped the soaks. I told pt to start epsom salt soaks and she said she was going on a trip and would be in a hotel. I explained how to cleanse in the shower 2 times daily with antibacterial soap and a clean wet wash cloth, rinse well, pat dry and cover with a Bacitracin bandaid, until the area had a dry hard scab without redness,drainage or swelling. Pt states understanding.

## 2016-11-20 ENCOUNTER — Ambulatory Visit: Payer: PPO | Admitting: Radiation Oncology

## 2016-11-26 ENCOUNTER — Ambulatory Visit: Payer: PPO | Admitting: Gynecologic Oncology

## 2016-12-03 ENCOUNTER — Encounter: Payer: Self-pay | Admitting: Radiation Oncology

## 2016-12-10 ENCOUNTER — Encounter: Payer: Self-pay | Admitting: Radiation Oncology

## 2016-12-29 ENCOUNTER — Ambulatory Visit (INDEPENDENT_AMBULATORY_CARE_PROVIDER_SITE_OTHER): Payer: PPO | Admitting: Podiatry

## 2016-12-29 DIAGNOSIS — L603 Nail dystrophy: Secondary | ICD-10-CM

## 2016-12-29 NOTE — Progress Notes (Signed)
   Subjective: Patient presents today for evaluation of her toenails to the bilateral lower extremities. Patient is concerned for yellow discoloration especially to the left great toenail. Patient is a recent cancer survivor and underwent chemotherapy causing nail dystrophy. Patient is currently taking vitamin D daily. Patient denies pain or trauma.   Objective/Physical Exam General: The patient is alert and oriented x3 in no acute distress.  Dermatology: Hyperkeratotic, dry brittle nails noted 1-5 bilateral. There is a yellow discoloration noted to the nail plate of the left great toenail. Skin is warm, dry and supple bilateral lower extremities. Negative for open lesions or macerations.  Vascular: Palpable pedal pulses bilaterally. No edema or erythema noted. Capillary refill within normal limits.  Neurological: Epicritic and protective threshold grossly intact bilaterally.   Musculoskeletal Exam: Range of motion within normal limits to all pedal and ankle joints bilateral. Muscle strength 5/5 in all groups bilateral.    Assessment: #1 onychodystrophy bilateral toenails 1-5   Plan of Care:  #1 Patient was evaluated. #2 today we discussed possible etiology of nail dystrophy likely due to chemotherapy. #3 mechanical debridement of nails 1-5 bilaterally was performed using a nail nipper without incident #4 tibial opted for conservative management. We will not remove the left great toenail despite the yellow discoloration. If symptoms worsen or if the toe becomes red or painful return to the clinic for total temporary nail avulsion left great toenail.   Edrick Kins, DPM [facility]  Dr. Edrick Kins, [address]                                        [place], [place] [address]                Office [phone]  Fax [phone]

## 2016-12-31 DIAGNOSIS — L821 Other seborrheic keratosis: Secondary | ICD-10-CM | POA: Diagnosis not present

## 2017-01-29 ENCOUNTER — Ambulatory Visit (INDEPENDENT_AMBULATORY_CARE_PROVIDER_SITE_OTHER): Payer: PPO | Admitting: Podiatry

## 2017-01-29 ENCOUNTER — Encounter: Payer: Self-pay | Admitting: Podiatry

## 2017-01-29 DIAGNOSIS — L603 Nail dystrophy: Secondary | ICD-10-CM | POA: Diagnosis not present

## 2017-01-29 DIAGNOSIS — M79609 Pain in unspecified limb: Secondary | ICD-10-CM

## 2017-01-29 DIAGNOSIS — B351 Tinea unguium: Secondary | ICD-10-CM | POA: Diagnosis not present

## 2017-01-30 NOTE — Progress Notes (Signed)
Subjective:     Patient ID: Teresa Norris, female   DOB: 1928-08-07, 83 y.o.   MRN: [id number]  HPI patient presents concerned about discoloration of the left big toenail left stating it occurred recently but is not painful currently   Review of Systems     Objective:   Physical Exam Neurovascular status intact negative Homans sign was noted with patient noted to have discoloration left hallux nail localized with no proximal edema erythema or drainage noted    Assessment:     Damaged left hallux nail with proximal trauma and no indications of infection    Plan:     Reviewed condition and at this time I recommended soaks padding of the area and if it should become loose become red her start draining or become painful it we'll need to be removed which I educated patient on

## 2017-02-02 ENCOUNTER — Telehealth: Payer: Self-pay | Admitting: Oncology

## 2017-02-02 NOTE — Telephone Encounter (Signed)
Teresa Norris called and said she has been having lower back pain for the past 2 weeks.  She said tylenol is not touching it and the pain is worse when sitting or lying down.  She denies having trouble urinating/having a bowel movement or having any vaginal bleeding.  Advised her that a follow up appointment with Dr. Sondra Come will be scheduled and we will call her back.

## 2017-02-02 NOTE — Telephone Encounter (Signed)
Called Teresa Norris back and advised her that a follow up appointment has been made for 02/05/17 at 1:30 pm.  Teresa Norris verbalized understanding and agreement.

## 2017-02-04 ENCOUNTER — Encounter: Payer: Self-pay | Admitting: Oncology

## 2017-02-05 ENCOUNTER — Ambulatory Visit
Admission: RE | Admit: 2017-02-05 | Discharge: 2017-02-05 | Disposition: A | Discharge status: Home / Self Care | Payer: PPO | Source: Ambulatory Visit | Attending: Radiation Oncology | Admitting: Radiation Oncology

## 2017-02-05 VITALS — BP 170/82 | HR 86 | Temp 98.2°F | Resp 20 | Wt 164.0 lb

## 2017-02-05 DIAGNOSIS — Z08 Encounter for follow-up examination after completed treatment for malignant neoplasm: Secondary | ICD-10-CM | POA: Diagnosis not present

## 2017-02-05 DIAGNOSIS — C775 Secondary and unspecified malignant neoplasm of intrapelvic lymph nodes: Secondary | ICD-10-CM

## 2017-02-05 DIAGNOSIS — C541 Malignant neoplasm of endometrium: Secondary | ICD-10-CM | POA: Diagnosis not present

## 2017-02-05 DIAGNOSIS — Z8542 Personal history of malignant neoplasm of other parts of uterus: Secondary | ICD-10-CM | POA: Diagnosis not present

## 2017-02-05 DIAGNOSIS — M545 Low back pain: Secondary | ICD-10-CM | POA: Diagnosis not present

## 2017-02-05 LAB — CBC WITH DIFFERENTIAL/PLATELET
BASO%: 0.2 % (ref 0.0–2.0)
BASOS ABS: 0 10*3/uL (ref 0.0–0.1)
EOS%: 3.7 % (ref 0.0–7.0)
Eosinophils Absolute: 0.2 10*3/uL (ref 0.0–0.5)
HEMATOCRIT: 41.9 % (ref 34.8–46.6)
HGB: 14 g/dL (ref 11.6–15.9)
LYMPH#: 0.3 10*3/uL — AB (ref 0.9–3.3)
LYMPH%: 7 % — ABNORMAL LOW (ref 14.0–49.7)
MCH: 30.2 pg (ref 25.1–34.0)
MCHC: 33.4 g/dL (ref 31.5–36.0)
MCV: 90.3 fL (ref 79.5–101.0)
MONO#: 0.6 10*3/uL (ref 0.1–0.9)
MONO%: 12.8 % (ref 0.0–14.0)
NEUT#: 3.5 10*3/uL (ref 1.5–6.5)
NEUT%: 76.3 % (ref 38.4–76.8)
PLATELETS: 179 10*3/uL (ref 145–400)
RBC: 4.64 10*6/uL (ref 3.70–5.45)
RDW: 13.5 % (ref 11.2–14.5)
WBC: 4.5 10*3/uL (ref 3.9–10.3)

## 2017-02-05 LAB — COMPREHENSIVE METABOLIC PANEL
ALK PHOS: 109 U/L (ref 40–150)
ALT: 64 U/L — AB (ref 0–55)
ANION GAP: 8 meq/L (ref 3–11)
AST: 34 U/L (ref 5–34)
Albumin: 4 g/dL (ref 3.5–5.0)
BUN: 18 mg/dL (ref 7.0–26.0)
CALCIUM: 9.6 mg/dL (ref 8.4–10.4)
CO2: 28 mEq/L (ref 22–29)
CREATININE: 1 mg/dL (ref 0.6–1.1)
Chloride: 101 mEq/L (ref 98–109)
EGFR: 53 mL/min/{1.73_m2} — ABNORMAL LOW (ref >90)
Glucose: 95 mg/dl (ref 70–140)
Potassium: 4.2 mEq/L (ref 3.5–5.1)
Sodium: 137 mEq/L (ref 136–145)
Total Bilirubin: 0.42 mg/dL (ref 0.20–1.20)
Total Protein: 7 g/dL (ref 6.4–8.3)

## 2017-02-05 NOTE — Progress Notes (Signed)
Teresa Norris here today for follow up for endometrial cancer.  She reports having pain that is rating 4 out of 10 in right lower back that is only slightly relieved with some walking.  Patient is taking tylenol for the discomfort and has to take ambien at night to help her get sleep because of the pain.  She denies any blood in stool or urine.  No difficulties with stool or urination.  Patient reports having fatigue that comes and goes.  No bloating.    Vitals:   02/05/17 1335  BP: (!) 170/82  Pulse: 86  Resp: 20  Temp: 98.2 F (36.8 C)  TempSrc: Oral  SpO2: 97%  Weight: 164 lb (74.4 kg)    Wt Readings from Last 3 Encounters:  02/05/17 164 lb (74.4 kg)  11/03/16 164 lb 12.8 oz (74.8 kg)  10/22/16 166 lb (75.3 kg)

## 2017-02-05 NOTE — Progress Notes (Signed)
Radiation Oncology         [phone] ________________________________  Name: Teresa Norris MRN: [id number]  Date: 02/05/2017  DOB: [birth year]  Follow-Up Visit Note  CC: Mathews Argyle, MD  Gordy Levan, MD    ICD-9-CM ICD-10-CM   1. 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 She reports 4/10 pain in the lower back that is slightly relieved with walking. The pain began 2-3 weeks ago and is worsening. Patient reports taking Tylenol during the day and Ambien at night for the pain. She does not wish to have a narcotic for this due to constipation. She denies blood in urine or stool. She denies bowel or bladder issues. She reports waxing and waning fatigue. She denies bloating.  She is having trouble sleeping at night in light of this pain.                         ALLERGIES:  is allergic to aspirin; advil [ibuprofen]; and e-mycin [erythromycin].  Meds: Current Outpatient Prescriptions  Medication Sig Dispense Refill  . acetaminophen (TYLENOL) 500 MG tablet Take 500-1,000 mg by mouth at bedtime as needed for mild pain.     . Cholecalciferol (VITAMIN D) 2000 units CAPS Take by mouth.    . clonazePAM (KLONOPIN) 0.5 MG tablet TAKE 1 TABLET BY MOUTH AT NIGHT 90 tablet 0  . levothyroxine (SYNTHROID, LEVOTHROID) 100 MCG tablet Take 100 mcg by mouth daily before breakfast.    . polyvinyl alcohol (LIQUIFILM TEARS) 1.4 % ophthalmic solution Place 2 drops into both eyes daily.     Marland Kitchen zolpidem (AMBIEN) 5 MG tablet Take 5 mg by  mouth at bedtime.     . docusate sodium (COLACE) 100 MG capsule Take 1 capsule (100 mg total) by mouth 2 (two) times daily. (Patient not taking: Reported on 11/05/2016) 30 capsule 0  . hydrocortisone (PROCTO-MED HC) 2.5 % rectal cream Place 1 application rectally daily as needed for hemorrhoids or itching (for internal hemorrhoids). (Patient not taking: Reported on 11/05/2016) 30 g 1  . loperamide (IMODIUM) 2 MG capsule Take by mouth as needed for diarrhea or loose stools.     No current facility-administered medications for this encounter.     Physical Findings: The patient is in no acute distress. Patient is alert and oriented.  weight is 164 lb (74.4 kg). Her oral temperature is 98.2 F (36.8 C). Her blood pressure is 170/82 (abnormal) and her pulse is 86. Her respiration is 20 and oxygen saturation is 97%. .  No significant changes. Lungs are clear to auscultation bilaterally. Heart has regular rate and rhythm. No palpable cervical, supraclavicular, or axillary adenopathy. Abdomen soft, non-tender, normal bowel sounds. Patient points to tenderness along the right sacroiliac joint area. The neurological examination is nonfocal. The patient wishes not to have a pelvic exam today and will have this exam performed with her visit next week with Dr. Denman George   Lab Findings: Lab Results  Component Value Date   WBC 4.5 02/05/2017   HGB  14.0 02/05/2017   HCT 41.9 02/05/2017   MCV 90.3 02/05/2017   PLT 179 02/05/2017    Radiographic Findings: No results found.  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  Pelvic exam was deferred to Dr. Denman George at the patient's request. She has new onset pain in the right lower back area which is somewhat concerning.  Plan:  Order CBC/CMET for today and CT scan for next Thursday if Dr. Denman George agrees following her exam.. Patient will follow up with Dr. Denman George on 02/11/17.   ____________________________________   This document serves as a  record of services personally performed by Gery Pray, MD. It was created on his behalf by Bethann Humble, a trained medical scribe. The creation of this record is based on the scribe's personal observations and the provider's statements to them. This document has been checked and approved by the attending provider.

## 2017-02-11 ENCOUNTER — Ambulatory Visit: Payer: PPO | Admitting: Gynecologic Oncology

## 2017-02-11 ENCOUNTER — Other Ambulatory Visit: Payer: Self-pay | Admitting: Oncology

## 2017-02-11 DIAGNOSIS — C541 Malignant neoplasm of endometrium: Secondary | ICD-10-CM

## 2017-02-12 ENCOUNTER — Ambulatory Visit (HOSPITAL_COMMUNITY)
Admission: RE | Admit: 2017-02-12 | Discharge: 2017-02-12 | Disposition: A | Discharge status: Home / Self Care | Payer: PPO | Source: Ambulatory Visit | Attending: Radiation Oncology | Admitting: Radiation Oncology

## 2017-02-12 DIAGNOSIS — C541 Malignant neoplasm of endometrium: Secondary | ICD-10-CM | POA: Diagnosis not present

## 2017-02-12 DIAGNOSIS — Z90710 Acquired absence of both cervix and uterus: Secondary | ICD-10-CM | POA: Diagnosis not present

## 2017-02-12 DIAGNOSIS — I700 Atherosclerosis of aorta: Secondary | ICD-10-CM | POA: Diagnosis not present

## 2017-02-12 DIAGNOSIS — K8080 Other cholelithiasis without obstruction: Secondary | ICD-10-CM | POA: Insufficient documentation

## 2017-02-12 DIAGNOSIS — N281 Cyst of kidney, acquired: Secondary | ICD-10-CM | POA: Diagnosis not present

## 2017-02-12 MED ORDER — IOPAMIDOL (ISOVUE-300) INJECTION 61%
INTRAVENOUS | Status: AC
  Administered 2017-02-12: 100 mL
  Filled 2017-02-12: qty 100

## 2017-02-14 DIAGNOSIS — M545 Low back pain: Secondary | ICD-10-CM | POA: Diagnosis not present

## 2017-02-14 DIAGNOSIS — R102 Pelvic and perineal pain: Secondary | ICD-10-CM | POA: Diagnosis not present

## 2017-02-16 ENCOUNTER — Telehealth: Payer: Self-pay | Admitting: Oncology

## 2017-02-16 ENCOUNTER — Encounter: Payer: Self-pay | Admitting: Radiation Oncology

## 2017-02-16 ENCOUNTER — Ambulatory Visit
Admission: RE | Admit: 2017-02-16 | Discharge: 2017-02-16 | Disposition: A | Discharge status: Home / Self Care | Payer: PPO | Source: Ambulatory Visit | Attending: Radiation Oncology | Admitting: Radiation Oncology

## 2017-02-16 VITALS — BP 172/83 | HR 94 | Temp 98.1°F | Ht 66.0 in | Wt 163.6 lb

## 2017-02-16 DIAGNOSIS — C775 Secondary and unspecified malignant neoplasm of intrapelvic lymph nodes: Secondary | ICD-10-CM | POA: Diagnosis not present

## 2017-02-16 DIAGNOSIS — Z712 Person consulting for explanation of examination or test findings: Secondary | ICD-10-CM | POA: Diagnosis not present

## 2017-02-16 DIAGNOSIS — Z90710 Acquired absence of both cervix and uterus: Secondary | ICD-10-CM | POA: Insufficient documentation

## 2017-02-16 DIAGNOSIS — C541 Malignant neoplasm of endometrium: Secondary | ICD-10-CM | POA: Insufficient documentation

## 2017-02-16 DIAGNOSIS — M545 Low back pain: Secondary | ICD-10-CM | POA: Diagnosis not present

## 2017-02-16 DIAGNOSIS — N281 Cyst of kidney, acquired: Secondary | ICD-10-CM | POA: Insufficient documentation

## 2017-02-16 DIAGNOSIS — Z8542 Personal history of malignant neoplasm of other parts of uterus: Secondary | ICD-10-CM | POA: Diagnosis not present

## 2017-02-16 DIAGNOSIS — R3989 Other symptoms and signs involving the genitourinary system: Secondary | ICD-10-CM | POA: Diagnosis not present

## 2017-02-16 MED ORDER — OXYCODONE-ACETAMINOPHEN 5-325 MG PO TABS: 1.0000 | ORAL_TABLET | ORAL | 0 refills | Status: DC | PRN

## 2017-02-16 NOTE — Progress Notes (Signed)
Radiation Oncology         [phone] ________________________________  Name: Teresa Norris MRN: [id number]  Date: 02/16/2017  DOB: 19-Jun-1928  Follow-Up Visit Note  CC: Mathews Argyle, MD  Gordy Levan, MD    ICD-9-CM ICD-10-CM   1. Endometrial ca (HCC) 182.0 C54.1   2. 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 She reports waxing and waning pain to her lower back and pelvis. She was given hydrocodone/acetaminophen at urgent care. She reports taking it twice with no relief. She notes the medication made her constipated. She is taking Tylenol 4 times per day. She reports burning in her bladder after urinating at night. She denies vaginal bleeding. No hematuria                    ALLERGIES:  is allergic to aspirin; advil [ibuprofen]; and e-mycin [erythromycin].  Meds: Current Outpatient Prescriptions  Medication Sig Dispense Refill  . acetaminophen (TYLENOL) 500 MG tablet Take 500-1,000 mg by mouth at bedtime as needed for mild pain.     . Cholecalciferol (VITAMIN D) 2000 units CAPS Take by mouth.    . clonazePAM (KLONOPIN) 0.5 MG tablet TAKE 1 TABLET BY MOUTH AT NIGHT 90 tablet 0  . HYDROcodone-acetaminophen (NORCO/VICODIN) 5-325 MG tablet Take 1 tablet by mouth every 4 (four) hours as needed for moderate pain.    Marland Kitchen levothyroxine (SYNTHROID, LEVOTHROID) 100 MCG tablet Take 100 mcg by mouth daily before breakfast.    . polyethylene glycol (MIRALAX / GLYCOLAX) packet Take 17 g by mouth daily.    . polyvinyl alcohol (LIQUIFILM TEARS) 1.4 % ophthalmic solution Place 2 drops into both eyes daily.     .  sennosides-docusate sodium (SENOKOT-S) 8.6-50 MG tablet Take 1 tablet by mouth daily.    Marland Kitchen zolpidem (AMBIEN) 5 MG tablet Take 5 mg by mouth at bedtime.     . docusate sodium (COLACE) 100 MG capsule Take 1 capsule (100 mg total) by mouth 2 (two) times daily. (Patient not taking: Reported on 11/05/2016) 30 capsule 0  . hydrocortisone (PROCTO-MED HC) 2.5 % rectal cream Place 1 application rectally daily as needed for hemorrhoids or itching (for internal hemorrhoids). (Patient not taking: Reported on 11/05/2016) 30 g 1  . loperamide (IMODIUM) 2 MG capsule Take by mouth as needed for diarrhea or loose stools.    Marland Kitchen oxyCODONE-acetaminophen (PERCOCET/ROXICET) 5-325 MG tablet Take 1 tablet by mouth every 4 (four) hours as needed for severe pain. 10 tablet 0   No current facility-administered medications for this encounter.     Physical Findings: The patient is in no acute distress. Patient is alert and oriented.  height is 5\' 6"  (1.676 m) and weight is 163 lb 9.6 oz (74.2 kg). Her oral temperature is 98.1 F (36.7 C). Her blood pressure is 172/83 (abnormal) and her pulse is 94. Her oxygen saturation is 97%. .     Lab Findings: Lab Results  Component Value Date   WBC 4.5 02/05/2017   HGB 14.0 02/05/2017   HCT 41.9 02/05/2017   MCV 90.3 02/05/2017   PLT 179 02/05/2017  Radiographic Findings: Ct Abdomen Pelvis W Contrast  Result Date: 02/13/2017 CLINICAL DATA:  Endometrial cancer, status post hysterectomy, chemotherapy/ XRT stopped due to illness EXAM: CT ABDOMEN AND PELVIS WITH CONTRAST TECHNIQUE: Multidetector CT imaging of the abdomen and pelvis was performed using the standard protocol following bolus administration of intravenous contrast. CONTRAST:  100 mL Isovue 300 IV COMPARISON:  05/22/2016 FINDINGS: Lower chest: Stable nodularity at the lung bases, including a 13 mm branching opacity in the left lower lobe (series 6/image 7), a 7 mm nodule in the medial left lower lobe (series 6/ image  19), and a 7 mm nodule in the lateral right lower lobe (series 6/image 20). Given stability, a benign etiology is favored. Hepatobiliary: New hypoenhancing lesions in the liver, suspicious for metastases, including: --12 mm lesion in segment 7 (series 2/ image 12) --8 mm lesion in segment 8 (series 2/ image 17) --9 mm vague lesion in segment 5 (series 2/image 28) 14 mm gallstone (series 2/ image 26), without associated inflammatory changes. No intrahepatic or extrahepatic ductal dilatation. Pancreas: 2.3 x 3.1 cm complex cystic lesion along the anterior aspect of the pancreatic tail (series 2/ image 22), previously 2.1 x 3.1 cm when measured in a similar fashion. Suspected enhancing septations with possible soft tissue component superiorly within the lesion (series 2/ image 21). Serous or less likely mucinous cystic neoplasm is not excluded. No associated pancreatic atrophy or ductal dilatation. Spleen: Within normal limits. Adrenals/Urinary Tract: Adrenal glands are within normal limits. 7.2 cm lateral left lower pole renal cyst (series 7/ image 25). Additional small bilateral renal cysts. No hydronephrosis. Bladder is mildly thick-walled although underdistended. Stomach/Bowel: Stomach is within normal limits. No evidence of bowel obstruction. Normal appendix (series 2/ image 45). Moderate right colonic stool burden. Left colon is decompressed. Vascular/Lymphatic: No evidence of abdominal aortic aneurysm. Mild atherosclerotic calcifications the abdominal aorta. Prominent retroperitoneal lymph nodes, including: --12 mm short axis left para-aortic node (series 2/ image 29) --10 mm short axis aortocaval node (series 2/ image 31) --13 mm short axis left para-aortic node (series 2/ image 35) --9 mm short axis left common iliac node (series 2/ image 38) Reproductive: Status post hysterectomy. No adnexal masses. Other: No abdominopelvic ascites. Musculoskeletal: Mild degenerative changes of the visualized thoracolumbar  spine. IMPRESSION: Status post hysterectomy. Prominent retroperitoneal lymph nodes, including a 13 mm short axis left para-aortic node, suspicious for nodal recurrence/metastases. New hypoenhancing lesions in the right liver, measuring up to 12 mm in segment 7, suspicious for hepatic metastases. Stable nodularity in the bilateral lower lobes, grossly unchanged since 2017, likely benign. 2.3 x 3.1 cm complex cystic lesion along the anterior aspect of the pancreatic tail, suspicious for serous or mucinous pancreatic neoplasm. Given the patient's age and comorbidities, attention on follow-up may be a reasonable conservative approach. If a more aggressive approach is desired, technically speaking, the lesion meets size criteria for EUS recommendation. Additional ancillary findings as above. Electronically Signed   By: Julian Hy M.D.   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 Recent scans are suspicious for recurrent disease within the retroperitoneal nodes and liver lesions. We discussed that the findings  don't explain an obvious reason for her pain. I discussed consideration for a MRI of the spine, but at this point the patient does not wish to proceed with any additional imaging. It could be the retroperitoneal nodes are causing some of her pain. We discussed options to consider including chemotherapy and  possible radiation therapy to periaortic area. At this time the patient is most comfortable proceeding with Hospice. The patient had questions about the progress of her disease/life expectancy and I recommended she keep her appointment with Dr. Denman George for further evaluation and discussion.  Plan:  Follow up in radiation oncology prn. Patient will follow up with Dr. Denman George on 02/18/17. I will refer the patient to Hospice with Dr. Felipa Eth listed as the point of contact per her wishes. Prescribed Percocet for pain  management.  ____________________________________   This document serves as a record of services personally performed by Gery Pray, MD. It was created on his behalf by Bethann Humble, a trained medical scribe. The creation of this record is based on the scribe's personal observations and the provider's statements to them. This document has been checked and approved by the attending provider.

## 2017-02-16 NOTE — Telephone Encounter (Signed)
Patient called and said she went to an Urgent Care over the weekend and was given hydrocodone/acetaminophen 5/325 mg.  She said she took it twice without relief.  She also said she saw the results of her scan on MyChart and wants to speak to Dr. Sondra Come this morning to see if she needs to call hospice.  Advised her that Dr. Sondra Come will be notified to call her regarding results this morning and changing the pain medication.  Teresa Norris verbalized understanding and agreement.

## 2017-02-16 NOTE — Progress Notes (Signed)
Teresa Norris is here for follow up after her recent CT scan.  She reports having pain on and off in her lower back and pelvis.  She was given hydrocodone/acetaminophen at her urgent care.  She took it twice with no relief.  She said it only made her constipated.  She is taking tylenol 4 times a day.  She reports having some burning in her bladder after urinating at night.  She denies having any vaginal bleeding.  BP (!) 172/83 (BP Location: Left Arm, Patient Position: Sitting)   Pulse 94   Temp 98.1 F (36.7 C) (Oral)   Ht 5\' 6"  (1.676 m)   Wt 163 lb 9.6 oz (74.2 kg)   SpO2 97%   BMI 26.41 kg/m    Wt Readings from Last 3 Encounters:  02/16/17 163 lb 9.6 oz (74.2 kg)  02/05/17 164 lb (74.4 kg)  11/03/16 164 lb 12.8 oz (74.8 kg)

## 2017-02-16 NOTE — Telephone Encounter (Signed)
Patient left a message asking for pain medication to take in addition to tylenol.  She said she would like to take 1 tablet of pain medication at night because the pain is worse then.  She requested a return call.

## 2017-02-18 ENCOUNTER — Ambulatory Visit: Payer: PPO | Admitting: Gynecologic Oncology

## 2017-03-02 NOTE — Addendum Note (Signed)
Encounter addended by: Jacqulyn Liner, RN on: 03/02/2017  8:00 AM<BR>    Actions taken: Charge Capture section accepted

## 2017-03-04 DIAGNOSIS — R2681 Unsteadiness on feet: Secondary | ICD-10-CM | POA: Diagnosis not present

## 2017-03-04 DIAGNOSIS — M6281 Muscle weakness (generalized): Secondary | ICD-10-CM | POA: Diagnosis not present

## 2017-03-04 DIAGNOSIS — M545 Low back pain: Secondary | ICD-10-CM | POA: Diagnosis not present

## 2017-03-08 NOTE — Progress Notes (Signed)
Follow-up Note: Gyn-Onc   Teresa Norris 83 y.o. female  Chief Complaint  Patient presents with  . Endometrial Cancer    Assessment : 83 year old woman with recurrent endometrioid endometrial cancer (new lesions in liver and PA nodes). Poor tolerance to original therapy, therefore, patient declined complete therapy. Had lengthy discussion regarding her prognosis, plans for hospice, symptom management. Clinical depression/reactive disorder.  Plan:  Interested in hospice. Will facilitate referral. Continue zoloft for depression. Consider remeron as alternative given symptoms of sleep difficulty and anorexia. Will see her back in 3 months for follow-up.  HPI: 83 year old white female seen in consultation request of Dr. Simona Huh regarding management of a newly diagnosed endometrial carcinoma. Proctoscopy 6 weeks ago the patient had her first onset of abnormal bleeding and 40 years. This abated but approximately a month later she began bleeding again. She was then seen by Dr.Varnado who obtained an ultrasound showing a normal uterus and ovaries. Endometrial biopsy was interpreted as adenocarcinoma. The patient continued to have some light bleeding. She denies any pelvic pain.  She has a past history of polio as a child and she claims that she has prolonged slow recovery following anesthesia.  The patient underwent robotic assisted total hysterectomy, BSO, SLN biopsy on 05/22/16. The surgery was uneventful. Pathology revealed a 6cm grade 2 tumor that was 100% invaded through the myometrium (4.3 of 4.3cm) with LVSI present. The adnexa and cervix were negative for tumor, however there were 3 pelvic lymph nodes positive for macrometastatic disease (>42mm).  Postoperative CT scan on 05/22/16 showed no evidence for distant metastases. It did show a 67mm right lower lobe indeterminate pulmonary nodules with probable mucoid impaction of several terminal bronchioles. Recommended follow-up in 6 months.  She  had initial dose dense carboplatin taxol from 06-26-16 thru day 1 cycle 3 on 08-07-16, then requested to stop chemotherapy due to bone marrow toxicity and fatigue.  She received 43 Gy external beam radiotherapy with IMRT with Dr Sondra Come between 10/19 and 10/21/16. She declined additional brachytherapy.  Interval Hx:  On 02/05/17 she was seen by Dr Sondra Come for routine follow-up. She reported new back pain and a CT abdo/pelvis was ordered. This scan was performed on 02/12/17 and showed prominent retroperitoneal lymph nodes, including a 13 mm short axis left para-aortic node, suspicious for nodal recurrence/metastases. New hypoenhancing lesions in the right liver, measuring up to 12 mm in segment 7, suspicious for hepatic metastases.  She is feeling very depressed with anorexia and fatigue. Difficulty falling asleep - takes [place]. Left hip pain x 3 months - history of polio on that side, but this is new pain. Cannot take NSAIDs. Is taking 3000-4000mg  tylenol each day. Declines opioids.   Review of Systems:10 point review of systems is negative except as noted in interval history (depression, anorexia, feeling warm, left hip to leg pain).   Vitals: Blood pressure (!) 160/91, pulse 78, temperature 97.8 F (36.6 C), temperature source Oral, resp. rate 20, weight 159 lb 11.2 oz (72.4 kg).  Physical Exam: deferred     Allergies  Allergen Reactions  . Aspirin Anaphylaxis and Other (See Comments)    STOMACH PAIN/BLEEDING ULCERS  . Advil [Ibuprofen]   . E-Mycin [Erythromycin] Other (See Comments)    Stomach problems    Past Medical History:  Diagnosis Date  . Anginal pain (HCC)    was not cardiac related  . Anxiety   . Arthritis   . Cancer [facility])    endometrial cancer  . Cataract  Both eyes  . Complication of anesthesia    states that anesthesia makes her groggy for about six weeks after surgery.  Hx. of polio   . Costochondritis   . History of radiation therapy 09/18/16-10/21/16   pelvis  43.2 Gy in 24 fractions  . Hypothyroid   . Muscle fatigue    Post polio  . Parasomnia    Night terrors  . Polio    At age 46 or 5 after a tonsillectomy    Past Surgical History:  Procedure Laterality Date  . ABDOMINAL HYSTERECTOMY    . BREAST SURGERY     BENIGN NODES  . CATARACT EXTRACTION    . EYE SURGERY Bilateral Lt 11/12   Rt 12/12   CATARACT  . 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 FOOT SURGERY  1938  . LEG SURGERY Left 1988  . LYMPH NODE BIOPSY N/A 05/06/2016   Procedure: Sentinel LYMPH NODE BIOPSY right total lymphandenapathy;  Surgeon: Everitt Amber, MD;  Location: [facility];  Service: Gynecology;  Laterality: N/A;  . ROBOTIC ASSISTED TOTAL HYSTERECTOMY WITH BILATERAL SALPINGO OOPHERECTOMY N/A 05/06/2016   Procedure: XI ROBOTIC ASSISTED TOTAL HYSTERECTOMY WITH BILATERAL SALPINGO OOPHORECTOMY;  Surgeon: Everitt Amber, MD;  Location: [facility];  Service: Gynecology;  Laterality: N/A;  . TONSILLECTOMY      Current Outpatient Prescriptions  Medication Sig Dispense Refill  . acetaminophen (TYLENOL) 500 MG tablet Take 500-1,000 mg by mouth at bedtime as needed for mild pain.     . Cholecalciferol (VITAMIN D) 2000 units CAPS Take by mouth.    . docusate sodium (COLACE) 100 MG capsule Take 1 capsule (100 mg total) by mouth 2 (two) times daily. 30 capsule 0  . levothyroxine (SYNTHROID, LEVOTHROID) 100 MCG tablet Take 100 mcg by mouth daily before breakfast.    . sertraline (ZOLOFT) 25 MG tablet     . zolpidem (AMBIEN) 5 MG tablet Take 5 mg by mouth at bedtime.      No current facility-administered medications for this visit.     Social History   Social History  . Marital status: Married    Spouse name: [name]  . Number of children: 3  . Years of education: 14   Occupational History  . Not on file.   Social History Main Topics  . Smoking status: Never Smoker  . Smokeless tobacco: Never Used  . Alcohol use Not on file     Comment: WINE   . Drug use: No  . Sexual  activity: Not on file   Other Topics Concern  . Not on file   Social History Narrative   Patient is married [name]) and lives at home with her husband.   Patient has three children.   Patient is retired.   Patient has a college education.   Patient drinks caffeine once in a while.    Family History  Problem Relation Age of Onset  . Parkinson's disease Father   . Acute myelogenous leukemia Brother   . Acute myelogenous leukemia       30 minutes of direct face to face counseling time was spent with the patient.   Donaciano Eva, MD 03/09/2017, 3:49 PM

## 2017-03-09 ENCOUNTER — Ambulatory Visit: Payer: PPO | Attending: Gynecologic Oncology | Admitting: Gynecologic Oncology

## 2017-03-09 ENCOUNTER — Encounter: Payer: Self-pay | Admitting: Gynecologic Oncology

## 2017-03-09 VITALS — BP 160/91 | HR 78 | Temp 97.8°F | Resp 20 | Wt 159.7 lb

## 2017-03-09 DIAGNOSIS — K769 Liver disease, unspecified: Secondary | ICD-10-CM | POA: Diagnosis not present

## 2017-03-09 DIAGNOSIS — R590 Localized enlarged lymph nodes: Secondary | ICD-10-CM | POA: Diagnosis not present

## 2017-03-09 DIAGNOSIS — E039 Hypothyroidism, unspecified: Secondary | ICD-10-CM | POA: Diagnosis not present

## 2017-03-09 DIAGNOSIS — Z8612 Personal history of poliomyelitis: Secondary | ICD-10-CM | POA: Insufficient documentation

## 2017-03-09 DIAGNOSIS — C541 Malignant neoplasm of endometrium: Secondary | ICD-10-CM | POA: Insufficient documentation

## 2017-03-09 DIAGNOSIS — F329 Major depressive disorder, single episode, unspecified: Secondary | ICD-10-CM | POA: Insufficient documentation

## 2017-03-09 DIAGNOSIS — F419 Anxiety disorder, unspecified: Secondary | ICD-10-CM | POA: Insufficient documentation

## 2017-03-09 DIAGNOSIS — F4320 Adjustment disorder, unspecified: Secondary | ICD-10-CM

## 2017-03-09 DIAGNOSIS — C775 Secondary and unspecified malignant neoplasm of intrapelvic lymph nodes: Secondary | ICD-10-CM

## 2017-03-09 NOTE — Patient Instructions (Signed)
Follow up with Dr. Denman George in 3 months. Call in June to schedule the appointment.

## 2017-03-10 ENCOUNTER — Telehealth: Payer: Self-pay

## 2017-03-10 NOTE — Telephone Encounter (Signed)
Made Hospice referral with Amber. Told Amber that Teresa Norris was looking into the new PCP at [facility] to be her PCP and shre symptom management with the [facility] physician.   Dr. Denman George ia agreeable to being attending if the PCP does not work out.

## 2017-03-17 ENCOUNTER — Telehealth: Payer: Self-pay | Admitting: Geriatric Medicine

## 2017-03-17 NOTE — Telephone Encounter (Signed)
Pts medical record in my office.  Waiting to schedule appointment with Dr. Bubba Camp at [facility]

## 2017-03-18 DIAGNOSIS — C541 Malignant neoplasm of endometrium: Secondary | ICD-10-CM | POA: Diagnosis not present

## 2017-03-18 DIAGNOSIS — F5101 Primary insomnia: Secondary | ICD-10-CM | POA: Diagnosis not present

## 2017-03-18 DIAGNOSIS — K5901 Slow transit constipation: Secondary | ICD-10-CM | POA: Diagnosis not present

## 2017-04-14 ENCOUNTER — Encounter: Payer: Self-pay | Admitting: Geriatric Medicine

## 2017-04-16 DIAGNOSIS — M25552 Pain in left hip: Secondary | ICD-10-CM | POA: Diagnosis not present

## 2017-04-16 DIAGNOSIS — M545 Low back pain: Secondary | ICD-10-CM | POA: Diagnosis not present

## 2017-04-29 ENCOUNTER — Encounter: Payer: Self-pay | Admitting: Internal Medicine

## 2017-05-01 ENCOUNTER — Emergency Department (HOSPITAL_COMMUNITY)
Admission: EM | Admit: 2017-05-01 | Discharge: 2017-05-01 | Disposition: A | Discharge status: Home / Self Care | Payer: PPO | Attending: Physician Assistant | Admitting: Physician Assistant

## 2017-05-01 ENCOUNTER — Encounter (HOSPITAL_COMMUNITY): Payer: Self-pay | Admitting: Emergency Medicine

## 2017-05-01 ENCOUNTER — Emergency Department (HOSPITAL_COMMUNITY): Payer: PPO

## 2017-05-01 DIAGNOSIS — J9811 Atelectasis: Secondary | ICD-10-CM | POA: Diagnosis not present

## 2017-05-01 DIAGNOSIS — Z79899 Other long term (current) drug therapy: Secondary | ICD-10-CM | POA: Diagnosis not present

## 2017-05-01 DIAGNOSIS — C220 Liver cell carcinoma: Secondary | ICD-10-CM | POA: Diagnosis not present

## 2017-05-01 DIAGNOSIS — M25552 Pain in left hip: Secondary | ICD-10-CM | POA: Diagnosis not present

## 2017-05-01 DIAGNOSIS — E860 Dehydration: Secondary | ICD-10-CM | POA: Insufficient documentation

## 2017-05-01 DIAGNOSIS — R079 Chest pain, unspecified: Secondary | ICD-10-CM | POA: Diagnosis not present

## 2017-05-01 LAB — COMPREHENSIVE METABOLIC PANEL
ALBUMIN: 2.8 g/dL — AB (ref 3.5–5.0)
ALK PHOS: 96 U/L (ref 38–126)
ALT: 21 U/L (ref 14–54)
ANION GAP: 9 (ref 5–15)
AST: 23 U/L (ref 15–41)
BUN: 30 mg/dL — ABNORMAL HIGH (ref 6–20)
CALCIUM: 9.1 mg/dL (ref 8.9–10.3)
CHLORIDE: 97 mmol/L — AB (ref 101–111)
CO2: 23 mmol/L (ref 22–32)
Creatinine, Ser: 1.59 mg/dL — ABNORMAL HIGH (ref 0.44–1.00)
GFR calc non Af Amer: 28 mL/min — ABNORMAL LOW (ref >60)
GFR, EST AFRICAN AMERICAN: 33 mL/min — AB (ref >60)
Glucose, Bld: 100 mg/dL — ABNORMAL HIGH (ref 65–99)
POTASSIUM: 5 mmol/L (ref 3.5–5.1)
SODIUM: 129 mmol/L — AB (ref 135–145)
Total Bilirubin: 0.9 mg/dL (ref 0.3–1.2)
Total Protein: 5.5 g/dL — ABNORMAL LOW (ref 6.5–8.1)

## 2017-05-01 LAB — I-STAT TROPONIN, ED: Troponin i, poc: 0 ng/mL (ref 0.00–0.08)

## 2017-05-01 LAB — CBC WITH DIFFERENTIAL/PLATELET
Basophils Absolute: 0 10*3/uL (ref 0.0–0.1)
Basophils Relative: 0 %
Eosinophils Absolute: 0.1 10*3/uL (ref 0.0–0.7)
Eosinophils Relative: 1 %
HEMATOCRIT: 38.1 % (ref 36.0–46.0)
HEMOGLOBIN: 12.6 g/dL (ref 12.0–15.0)
LYMPHS ABS: 0.7 10*3/uL (ref 0.7–4.0)
LYMPHS PCT: 7 %
MCH: 29.6 pg (ref 26.0–34.0)
MCHC: 33.1 g/dL (ref 30.0–36.0)
MCV: 89.4 fL (ref 78.0–100.0)
MONO ABS: 0.9 10*3/uL (ref 0.1–1.0)
Monocytes Relative: 9 %
NEUTROS ABS: 8.1 10*3/uL — AB (ref 1.7–7.7)
NEUTROS PCT: 83 %
Platelets: 162 10*3/uL (ref 150–400)
RBC: 4.26 MIL/uL (ref 3.87–5.11)
RDW: 14.2 % (ref 11.5–15.5)
WBC: 9.9 10*3/uL (ref 4.0–10.5)

## 2017-05-01 MED ORDER — SODIUM CHLORIDE 0.9 % IV BOLUS (SEPSIS)
1000.0000 mL | Freq: Once | INTRAVENOUS | Status: AC
  Administered 2017-05-01: 1000 mL via INTRAVENOUS

## 2017-05-01 MED ORDER — HYDROCODONE-ACETAMINOPHEN 10-325 MG PO TABS
1.0000 | ORAL_TABLET | Freq: Once | ORAL | Status: AC
  Administered 2017-05-01: 1 via ORAL
  Filled 2017-05-01: qty 1

## 2017-05-01 NOTE — ED Notes (Signed)
Hospice nurse calls and requests to get hip xray per hospice MD.

## 2017-05-01 NOTE — ED Notes (Signed)
Amy from hospice here with patient.

## 2017-05-01 NOTE — ED Triage Notes (Signed)
[facility] complaining of chest pain that began this morning. Patient describes chest pain as pressure that radiated to her back. When ems arrived pain had subsided. Patient only complaining of lower back and abdominal pain upon arrival to ED. Patient has liver cancer and is currently recieving hospice care and not receiving any radiation or chemotherapy at this time.

## 2017-05-01 NOTE — Progress Notes (Signed)
[facility] Liaison:  RN visit  Teresa Norris and visited patient in the Emergency Department.  Patient has husband and son at bedside.  Patient alert and oriented.  NAD.  Patient denies chest pain at this time saying that it has resolved.  Patient states she is still having a 2/10 pain in L hip, but that it feels better.    Hayley, RN at bedside to hang fluids, "they think she is dehydrated".  MDs waiting on blood work and [place].  No decisions have been made as to whether patient will be admitted at this time.   If you have any hospice related questions, please feel free to contact me.   Thank you,  Edyth Gunnels, RN, BSN [facility] Liaison [phone]  All hospital liaisons are now on [place].

## 2017-05-01 NOTE — Discharge Instructions (Signed)
Please take plenty of fluids. You may return with any needs.

## 2017-05-01 NOTE — ED Notes (Signed)
EDP at bedside  

## 2017-05-01 NOTE — ED Provider Notes (Signed)
[place] DEPT Provider Note   CSN: [id number] Arrival date & time: 05/01/17  7253     History   Chief Complaint Chief Complaint  Patient presents with  . Chest Pain    HPI BRYTNI DRAY is a 83 y.o. female.  HPI   Patient is an 37 her old female with history of metastatic liver cancer, on home hospice. Patient's presenting today with chest pain. She had chest pain last night and continuing until this morning. It is since disappeared. Patient reports he was in her central chest. Associated with mild shortness of breath did not radiate anywhere. No diaphoresis.  Patient also has been having increasing pain in her left hip and hospice called and asked [place] to do an x-ray while she was here.  Past Medical History:  Diagnosis Date  . Anginal pain (HCC)    was not cardiac related  . Anxiety   . Arthritis   . Cancer [facility])    endometrial cancer  . Cataract    Both eyes  . Complication of anesthesia    states that anesthesia makes her groggy for about six weeks after surgery.  Hx. of polio   . Costochondritis   . History of radiation therapy 09/18/16-10/21/16   pelvis 43.2 Gy in 24 fractions  . Hypothyroid   . Muscle fatigue    Post polio  . Parasomnia    Night terrors  . Polio    At age 66 or 5 after a tonsillectomy    Patient Active Problem List   Diagnosis Date Noted  . Encounter for antineoplastic chemotherapy 08/08/2016  . Hemorrhoid 07/26/2016  . Constipation 07/01/2016  . Bright red rectal bleeding 06/20/2016  . Ecchymosis 06/20/2016  . Personal history of poliomyelitis 06/08/2016  . Hypothyroidism (acquired) 06/08/2016  . 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 Endometrial ca ([place]) 05/06/2016  . Osteitis pubis ([place]) 03/23/2013  . Osteopenia 03/23/2013  . Fibroid uterus 03/23/2013    Past Surgical History:  Procedure Laterality Date  . ABDOMINAL HYSTERECTOMY    . BREAST SURGERY     BENIGN NODES  . CATARACT EXTRACTION     . EYE SURGERY Bilateral Lt 11/12   Rt 12/12   CATARACT  . 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 FOOT SURGERY  1938  . LEG SURGERY Left 1988  . LYMPH NODE BIOPSY N/A 05/06/2016   Procedure: Sentinel LYMPH NODE BIOPSY right total lymphandenapathy;  Surgeon: Everitt Amber, MD;  Location: [facility];  Service: Gynecology;  Laterality: N/A;  . ROBOTIC ASSISTED TOTAL HYSTERECTOMY WITH BILATERAL SALPINGO OOPHERECTOMY N/A 05/06/2016   Procedure: XI ROBOTIC ASSISTED TOTAL HYSTERECTOMY WITH BILATERAL SALPINGO OOPHORECTOMY;  Surgeon: Everitt Amber, MD;  Location: [facility];  Service: Gynecology;  Laterality: N/A;  . TONSILLECTOMY      OB History    Gravida Para Term Preterm AB Living   4 3     1 3    SAB TAB Ectopic Multiple Live Births   1               Home Medications    Prior to Admission medications   Medication Sig Start Date End Date Taking? Authorizing Provider  acetaminophen (TYLENOL) 500 MG tablet Take 500-1,000 mg by mouth at bedtime as needed for mild pain.    Yes [provider]  cholecalciferol (VITAMIN D) 1000 units tablet Take 1,000 Units by mouth daily.   Yes [provider]  citalopram (  CELEXA) 20 MG tablet Take 20 mg by mouth daily.   Yes [provider]  docusate sodium (COLACE) 100 MG capsule Take 1 capsule (100 mg total) by mouth 2 (two) times daily. 05/07/16  Yes Everitt Amber, MD  HYDROcodone-acetaminophen [facility]) 10-325 MG tablet Take 1 tablet by mouth every 6 (six) hours as needed for moderate pain.   Yes [provider]  levothyroxine (SYNTHROID, LEVOTHROID) 100 MCG tablet Take 100 mcg by mouth daily before breakfast.   Yes [provider]  lidocaine (LIDODERM) 5 % Place 1 patch onto the skin daily. Remove & Discard patch within 12 hours or as directed by MD   Yes [provider]  LORazepam (ATIVAN) 0.5 MG tablet Take 0.5 mg by mouth every 8 (eight) hours.   Yes [provider]  mirtazapine (REMERON) 7.5 MG  tablet Take 7.5 mg by mouth at bedtime.   Yes [provider]  polyethylene glycol (MIRALAX / GLYCOLAX) packet Take 17 g by mouth daily as needed.   Yes [provider]  prochlorperazine (COMPAZINE) 10 MG tablet Take 10 mg by mouth every 6 (six) hours as needed for nausea or vomiting.   Yes [provider]  zolpidem (AMBIEN) 5 MG tablet Take 5 mg by mouth at bedtime.  08/07/16  Yes [provider]  clonazePAM (KLONOPIN) 0.5 MG tablet Take 0.5 mg by mouth daily as needed for anxiety.    [provider]  dexamethasone (DECADRON) 4 MG tablet Take 5 tablets (20 mg) by mouth prior to Taxol treatment.    [provider]  ondansetron (ZOFRAN) 8 MG tablet Take 8 mg by mouth every 8 (eight) hours as needed for nausea or vomiting.    [provider]  sertraline (ZOLOFT) 25 MG tablet Take 25 mg by mouth daily.  02/23/17   [provider]    Family History Family History  Problem Relation Age of Onset  . Parkinson's disease Father   . Acute myelogenous leukemia Brother   . Acute myelogenous leukemia Unknown     Social History Social History  Substance Use Topics  . Smoking status: Never Smoker  . Smokeless tobacco: Never Used  . Alcohol use 1.8 oz/week    3 Standard drinks or equivalent per week     Comment: WINE      Allergies   Aspirin; Advil [ibuprofen]; and E-mycin [erythromycin]   Review of Systems Review of Systems  Constitutional: Negative for fatigue and fever.  Respiratory: Negative for chest tightness and shortness of breath.   Cardiovascular: Positive for chest pain.  Musculoskeletal: Positive for back pain.     Physical Exam Updated Vital Signs BP (!) 158/82   Pulse 80   Temp 98.1 F (36.7 C) (Oral)   Resp 12   Ht 5' 5.5" (1.664 m)   Wt 70.3 kg (155 lb)   SpO2 92%   BMI 25.40 kg/m   Physical Exam  Constitutional: She is oriented to person, place, and time. She appears well-developed and  well-nourished.  HENT:  Head: Normocephalic and atraumatic.  Eyes: Right eye exhibits no discharge.  Cardiovascular: Normal rate and regular rhythm.   No murmur heard. Pulmonary/Chest: Effort normal and breath sounds normal. No respiratory distress.  Mild pain to anterior chest wall upon palpation.  Musculoskeletal:  Pain to palpation of left hip.  Neurological: She is oriented to person, place, and time.  Skin: Skin is warm and dry. She is not diaphoretic.  Psychiatric: She has a normal  mood and affect.  Nursing note and vitals reviewed.    ED Treatments / Results  Labs (all labs ordered are listed, but only abnormal results are displayed) Labs Reviewed  COMPREHENSIVE METABOLIC PANEL - Abnormal; Notable for the following:       Result Value   Sodium 129 (*)    Chloride 97 (*)    Glucose, Bld 100 (*)    BUN 30 (*)    Creatinine, Ser 1.59 (*)    Total Protein 5.5 (*)    Albumin 2.8 (*)    GFR calc non Af Amer 28 (*)    GFR calc Af Amer 33 (*)    All other components within normal limits  CBC WITH DIFFERENTIAL/PLATELET - Abnormal; Notable for the following:    Neutro Abs 8.1 (*)    All other components within normal limits  I-STAT TROPOININ, ED    EKG  EKG Interpretation  Date/Time:  Friday May 01 2017 08:51:32 EDT Ventricular Rate:  75 PR Interval:    QRS Duration: 74 QT Interval:  357 QTC Calculation: 394 R Axis:   34 Text Interpretation:  Sinus rhythm Nonspecific T abnormalities, lateral leads Normal sinus rhythm Confirmed by Thomasene Lot, [place] [phone]) on 05/01/2017 9:44:07 AM       Radiology Dg Chest 2 View  Result Date: 05/01/2017 CLINICAL DATA:  Chest and anterior LEFT hip pain for a few weeks worse today, history endometrial cancer metastatic to bone EXAM: CHEST  2 VIEW COMPARISON:  05/05/2016, interval CT chest 05/22/2016 FINDINGS: Slightly rotated to the RIGHT. Normal heart size, mediastinal contours, and pulmonary vascularity. Atherosclerotic  calcification aorta. Mild RIGHT basilar atelectasis. Lungs otherwise clear. No pulmonary infiltrate, pleural effusion or pneumothorax. Bones appear demineralized. IMPRESSION: RIGHT basilar atelectasis. Aortic atherosclerosis. Electronically Signed   By: Lavonia Dana M.D.   On: 05/01/2017 10:56   Dg Hip Unilat W Or Wo Pelvis 2-3 Views Left  Result Date: 05/01/2017 CLINICAL DATA:  Chest and anterior LEFT hip pain for few weeks worse today, history of endometrial cancer metastatic to bone EXAM: DG HIP (WITH OR WITHOUT PELVIS) 2-3V LEFT COMPARISON:  None FINDINGS: Osseous demineralization. Hip and SI joint spaces preserved. Minimal osteitis pubis. Question small bone island RIGHT supra-acetabular. No acute fracture, dislocation, or bone destruction. IMPRESSION: No acute osseous abnormalities. Electronically Signed   By: Lavonia Dana M.D.   On: 05/01/2017 10:57    Procedures Procedures (including critical care time)  Medications Ordered in ED Medications  HYDROcodone-acetaminophen (NORCO) 10-325 MG per tablet 1 tablet (1 tablet Oral Given 05/01/17 1013)  sodium chloride 0.9 % bolus 1,000 mL (0 mLs Intravenous Stopped 05/01/17 1224)     Initial Impression / Assessment and Plan / ED Course  I have reviewed the triage vital signs and the nursing notes.  Pertinent labs & imaging results that were available during my care of the patient were reviewed by me and considered in my medical decision making (see chart for details).     Patient is a pleasant 83 year old female with metastatic liver cancer, on home hospice. Extensive discussion about what her goals for treatment today where. We decided to do initiate with some labs, chest x-ray, x-ray of her left hip. We will know that normally we would admit for chest pain in her age group with her risk factors, however patient like to review the initial set of labs first and then make that decision.  12:32 PM Patient elected for some fluids, no repeat troponin  and  does not want admission.   Discussed with family at bedside.    Final Clinical Impressions(s) / ED Diagnoses   Final diagnoses:  Dehydration    New Prescriptions New Prescriptions   No medications on file     Macarthur Critchley, MD 05/01/17 1232

## 2017-05-01 NOTE — ED Notes (Addendum)
Patient transported to X-ray on heart monitor  

## 2017-05-01 NOTE — ED Notes (Signed)
Patient given something to eat and drink per EDP approval.

## 2017-05-12 ENCOUNTER — Encounter: Payer: Self-pay | Admitting: Geriatric Medicine

## 2017-05-19 ENCOUNTER — Encounter: Payer: PPO | Admitting: Internal Medicine

## 2017-05-20 ENCOUNTER — Non-Acute Institutional Stay: Payer: PPO | Admitting: Internal Medicine

## 2017-05-20 ENCOUNTER — Encounter: Payer: Self-pay | Admitting: Internal Medicine

## 2017-05-20 ENCOUNTER — Encounter: Payer: PPO | Admitting: Internal Medicine

## 2017-05-20 VITALS — BP 122/80 | HR 84 | Temp 97.6°F | Resp 16 | Ht 66.0 in | Wt 160.2 lb

## 2017-05-20 DIAGNOSIS — N3000 Acute cystitis without hematuria: Secondary | ICD-10-CM

## 2017-05-20 DIAGNOSIS — M25552 Pain in left hip: Secondary | ICD-10-CM

## 2017-05-20 DIAGNOSIS — R600 Localized edema: Secondary | ICD-10-CM

## 2017-05-20 DIAGNOSIS — F5104 Psychophysiologic insomnia: Secondary | ICD-10-CM

## 2017-05-20 DIAGNOSIS — F418 Other specified anxiety disorders: Secondary | ICD-10-CM

## 2017-05-20 DIAGNOSIS — E039 Hypothyroidism, unspecified: Secondary | ICD-10-CM | POA: Diagnosis not present

## 2017-05-20 DIAGNOSIS — R630 Anorexia: Secondary | ICD-10-CM | POA: Diagnosis not present

## 2017-05-20 DIAGNOSIS — K5909 Other constipation: Secondary | ICD-10-CM | POA: Diagnosis not present

## 2017-05-20 DIAGNOSIS — M8580 Other specified disorders of bone density and structure, unspecified site: Secondary | ICD-10-CM | POA: Diagnosis not present

## 2017-05-20 DIAGNOSIS — N3289 Other specified disorders of bladder: Secondary | ICD-10-CM | POA: Diagnosis not present

## 2017-05-20 DIAGNOSIS — C541 Malignant neoplasm of endometrium: Secondary | ICD-10-CM

## 2017-05-20 MED ORDER — NITROFURANTOIN MONOHYD MACRO 100 MG PO CAPS: 100.0000 mg | ORAL_CAPSULE | Freq: Two times a day (BID) | ORAL | Status: AC

## 2017-05-20 NOTE — Progress Notes (Signed)
Provider: Blanchie Serve, MD  Code Status:  DNR  Goals of Care:  Advanced Directives 05/01/2017  Does Patient Have a Medical Advance Directive? Yes  Type of Paramedic of [place];Living will  Does patient want to make changes to medical advance directive? -  Copy of [place] in Chart? -     Chief Complaint  Patient presents with  . Medical Management of Chronic Issues    New patient    HPI: Patient is a 83 y.o. female seen today for new patient visit. She has history of emdometrial cancer with lesion to liver and PA nodes. She is on comfort care with hospice services. She has history of hemorrhoids and has occasional bleed. She has constipation and is taking miralax that helps. She has hypothyroidism and takes her levothyroxine. She has history of poliomyelitis affecting her left side. She is residing in independent living home at [facility]. She is on a wheelchair today wheeled by our Education officer, museum for this visit. She feels tired and has low energy level. She has trouble with urination x 1 week. She complaints of burning and pain with urination, has urgency and when she urinates, it is scant amount. She has had minimal urine output since last night.    Past Medical History:  Diagnosis Date  . Anginal pain (HCC)    was not cardiac related  . Anxiety   . Arthritis   . Cancer [facility])    endometrial cancer  . Cataract    Both eyes  . Complication of anesthesia    states that anesthesia makes her groggy for about six weeks after surgery.  Hx. of polio   . Costochondritis   . History of radiation therapy 09/18/16-10/21/16   pelvis 43.2 Gy in 24 fractions  . Hypothyroid   . Muscle fatigue    Post polio  . Parasomnia    Night terrors  . Polio    At age 39 or 5 after a tonsillectomy    Past Surgical History:  Procedure Laterality Date  . ABDOMINAL HYSTERECTOMY    . BREAST SURGERY     BENIGN NODES  . CATARACT EXTRACTION    .  EYE SURGERY Bilateral Lt 11/12   Rt 12/12   CATARACT  . 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 FOOT SURGERY  1938  . LEG SURGERY Left 1988  . LYMPH NODE BIOPSY N/A 05/06/2016   Procedure: Sentinel LYMPH NODE BIOPSY right total lymphandenapathy;  Surgeon: Everitt Amber, MD;  Location: [facility];  Service: Gynecology;  Laterality: N/A;  . ROBOTIC ASSISTED TOTAL HYSTERECTOMY WITH BILATERAL SALPINGO OOPHERECTOMY N/A 05/06/2016   Procedure: XI ROBOTIC ASSISTED TOTAL HYSTERECTOMY WITH BILATERAL SALPINGO OOPHORECTOMY;  Surgeon: Everitt Amber, MD;  Location: [facility];  Service: Gynecology;  Laterality: N/A;  . TONSILLECTOMY      Allergies  Allergen Reactions  . Aspirin Anaphylaxis and Other (See Comments)    STOMACH PAIN/BLEEDING ULCERS  . Advil [Ibuprofen]   . E-Mycin [Erythromycin] Other (See Comments)    Stomach problems    Allergies as of 05/20/2017      Reactions   Aspirin Anaphylaxis, Other (See Comments)   STOMACH PAIN/BLEEDING ULCERS   Advil [ibuprofen]    E-mycin [erythromycin] Other (See Comments)   Stomach problems      Medication List       Accurate as of 05/20/17  9:52 AM. Always use your most recent med list.  calcium carbonate 500 MG chewable tablet Commonly known as:  TUMS - dosed in mg elemental calcium Chew 1 tablet by mouth daily as needed for indigestion or heartburn. Takes with pain medication if she hasn't eaten anything.   CELEXA 20 MG tablet Generic drug:  citalopram Take 20 mg by mouth daily.   cholecalciferol 1000 units tablet Commonly known as:  VITAMIN D Take 2,000 Units by mouth daily.   docusate sodium 100 MG capsule Commonly known as:  COLACE Take 100 mg by mouth daily as needed for mild constipation. Take 2-3 capsules   HYDROcodone-acetaminophen 10-325 MG tablet Commonly known as:  NORCO Take 1 tablet by mouth 4 (four) times daily.   levothyroxine 100 MCG tablet Commonly known as:  SYNTHROID, LEVOTHROID Take 100 mcg by mouth daily  before breakfast.   lidocaine 5 % Commonly known as:  LIDODERM Place 1 patch onto the skin daily. Remove & Discard patch within 12 hours or as directed by MD   LORazepam 0.5 MG tablet Commonly known as:  ATIVAN Take 0.5 mg by mouth. Take 1 tab at 8 am and 4 pm. Then 2 tab   mirtazapine 7.5 MG tablet Commonly known as:  REMERON Take 7.5 mg by mouth at bedtime.   polyethylene glycol packet Commonly known as:  MIRALAX / GLYCOLAX Take 17 g by mouth daily as needed.   zolpidem 5 MG tablet Commonly known as:  AMBIEN Take 5 mg by mouth at bedtime.       Review of Systems:  Review of Systems  Constitutional: Positive for appetite change, diaphoresis and fatigue. Negative for chills and fever.       Night sweats drenches her 1-2 times a week. Poor appetite.   HENT: Positive for postnasal drip. Negative for congestion, ear pain, mouth sores, nosebleeds, tinnitus and trouble swallowing.   Eyes: Negative for pain and visual disturbance.       Has reading glasses  Respiratory: Negative for cough, shortness of breath and wheezing.   Cardiovascular: Positive for leg swelling. Negative for chest pain and palpitations.       Increased leg swelling  Gastrointestinal: Positive for abdominal distention, abdominal pain and constipation. Negative for nausea and vomiting.       Has history of hemorrhoids and has noted blood in her tissue paper. Occasional gagging  Endocrine: Positive for cold intolerance.  Genitourinary: Positive for decreased urine volume, dysuria, flank pain and frequency. Negative for hematuria and vaginal bleeding.  Musculoskeletal: Positive for gait problem.       Using cane and rollator walker inside her house, for clinic appointment she is on wheelchair. Left hip pain radiating to her groin at times  Skin: Negative for wound.  Neurological: Positive for weakness. Negative for dizziness, tremors, numbness and headaches.  Hematological: Bruises/bleeds easily.    Psychiatric/Behavioral: The patient is nervous/anxious.        Has been depressed with her current health situation    Health Maintenance  Topic Date Due  . DEXA SCAN  05/12/1995  . INFLUENZA VACCINE  07/01/2017  . TETANUS/TDAP  03/23/2023  . PNA vac Low Risk Adult  Completed    Physical Exam: Vitals:   05/20/17 0944  BP: 122/80  Pulse: 84  Resp: 16  Temp: 97.6 F (36.4 C)  TempSrc: Oral  SpO2: 92%  Weight: 160 lb 3.2 oz (72.7 kg)  Height: 5\' 6"  (1.676 m)   Body mass index is 25.86 kg/m. Physical Exam  Constitutional:  Frail, elderly female, in  no acute distress  HENT:  Head: Normocephalic and atraumatic.  Mouth/Throat: Oropharynx is clear and moist.  Eyes: Conjunctivae and EOM are normal. Pupils are equal, round, and reactive to light.  Neck: Normal range of motion. Neck supple.  Cardiovascular: Normal rate and regular rhythm.   Pulmonary/Chest: Effort normal and breath sounds normal. No respiratory distress. She has no wheezes. She has no rales.  Abdominal: Soft. Bowel sounds are normal. She exhibits distension. There is tenderness. There is no rebound.  Suprapubic tenderness present, lower quadrant distension present  Musculoskeletal:  On wheelchair, can move all 4 extremities, generalized weakness, 1+ leg edema  Lymphadenopathy:    She has no cervical adenopathy.  Skin: Skin is warm and dry.  Psychiatric: She has a normal mood and affect. Judgment and thought content normal.    Labs reviewed: Basic Metabolic Panel:  Recent Labs  10/06/16 1318 02/05/17 1506 05/01/17 0942  NA 141 137 129*  K 4.1 4.2 5.0  CL  --   --  97*  CO2 28 28 23   GLUCOSE 112 95 100*  BUN 18.7 18.0 30*  CREATININE 1.0 1.0 1.59*  CALCIUM 9.1 9.6 9.1   Liver Function Tests:  Recent Labs  10/06/16 1318 02/05/17 1506 05/01/17 0942  AST 14 34 23  ALT 14 64* 21  ALKPHOS 74 109 96  BILITOT 0.45 0.42 0.9  PROT 6.5 7.0 5.5*  ALBUMIN 3.6 4.0 2.8*   No results for input(s):  LIPASE, AMYLASE in the last 8760 hours. No results for input(s): AMMONIA in the last 8760 hours. CBC:  Recent Labs  10/06/16 1318 02/05/17 1506 05/01/17 0942  WBC 3.0* 4.5 9.9  NEUTROABS 2.1 3.5 8.1*  HGB 12.8 14.0 12.6  HCT 39.3 41.9 38.1  MCV 94.7 90.3 89.4  PLT 135* 179 162   Lipid Panel: No results for input(s): CHOL, HDL, LDLCALC, TRIG, CHOLHDL, LDLDIRECT in the last 8760 hours. No results found for: HGBA1C  Procedures since last visit: Dg Chest 2 View  Result Date: 05/01/2017 CLINICAL DATA:  Chest and anterior LEFT hip pain for a few weeks worse today, history endometrial cancer metastatic to bone EXAM: CHEST  2 VIEW COMPARISON:  05/05/2016, interval CT chest 05/22/2016 FINDINGS: Slightly rotated to the RIGHT. Normal heart size, mediastinal contours, and pulmonary vascularity. Atherosclerotic calcification aorta. Mild RIGHT basilar atelectasis. Lungs otherwise clear. No pulmonary infiltrate, pleural effusion or pneumothorax. Bones appear demineralized. IMPRESSION: RIGHT basilar atelectasis. Aortic atherosclerosis. Electronically Signed   By: Lavonia Dana M.D.   On: 05/01/2017 10:56   Dg Hip Unilat W Or Wo Pelvis 2-3 Views Left  Result Date: 05/01/2017 CLINICAL DATA:  Chest and anterior LEFT hip pain for few weeks worse today, history of endometrial cancer metastatic to bone EXAM: DG HIP (WITH OR WITHOUT PELVIS) 2-3V LEFT COMPARISON:  None FINDINGS: Osseous demineralization. Hip and SI joint spaces preserved. Minimal osteitis pubis. Question small bone island RIGHT supra-acetabular. No acute fracture, dislocation, or bone destruction. IMPRESSION: No acute osseous abnormalities. Electronically Signed   By: Lavonia Dana M.D.   On: 05/01/2017 10:57    Assessment/Plan  UTI With dysuria and suprapubic tenderness on exam, start nitrofurantoin 100 mg bid x 5 days and pyridium 100 mg tid x 3 days, encouraged hydration. Monitor urine output. Send u/a with c/s  Bladder distension Likely  from endometrial cancer progression. Monitor urine output, if unable to void in 4 hrs, will need to place foley catheter to assess for urine output, informed nursing of this care  plan, agrees.  Endometrial cancer with metastases Under hospice care, comfort care is the goal. She has had increased difficulty performing her ADLs at present. Recommended transferring her care to SNF but she wants to be at independent living until she feels she is able to for financial reasons. Supportive care to be provided as needed. Continue norco 10-325 mg qid for now for pain and lorazepam for anxiety.   Left hip pain Continue norco qid and lidocaine patch for now, followed by hospice service  Leg edema Appears to have lymphedema from her cancer. Third spacing from protein calorie malnutrition is likely contributing as well. Advised to keep legs elevated at rest. She wont be able to apply ted hose by herself. Consider lasix/ diuretic if neeed  Poor appetite On remeron, encouraged po intake and supplemental drinks as tolerated, comfort care is the goal  Hypothyroidism C/w levothyroxine 100 mcg daily  Depression and anxiety Situational. Continue remeron and celexa for now with her scheduled ativan  Insomnia Continue her ambien  Osteopenia Continue vitamin d supplement  constipation With her on pain meds, continue colace as needed and miralax   I feel the resident should be transferred to SNF given her fraility and deconditioning for comfort care and management. Patient not willing at present. To provide assistance and transfer to SNF if pt agrees.   Labs/tests ordered:  U/a with c/s  Next appt:  3 month or earlier if needed.

## 2017-05-21 ENCOUNTER — Encounter: Payer: PPO | Admitting: Internal Medicine

## 2017-05-21 ENCOUNTER — Telehealth: Payer: Self-pay | Admitting: *Deleted

## 2017-05-21 DIAGNOSIS — N390 Urinary tract infection, site not specified: Secondary | ICD-10-CM | POA: Diagnosis not present

## 2017-05-21 DIAGNOSIS — R509 Fever, unspecified: Secondary | ICD-10-CM | POA: Diagnosis not present

## 2017-05-21 NOTE — Telephone Encounter (Signed)
Ilsa Iha with [facility] called and left message on Clinical Intake requesting Dr. Jackolyn Confer last [facility] note yesterday because they are wanting to move patient into Skilled. Stated that they never received page 9 of the progress note.  I called and spoke with nurse and she stated that Joelene Millin had just left to go to [facility]. Explained to her that progress note printed twice but only gives 6 pages, so there is no page of 9. Will relay message.

## 2017-05-25 ENCOUNTER — Encounter: Payer: Self-pay | Admitting: Internal Medicine

## 2017-05-25 ENCOUNTER — Non-Acute Institutional Stay (SKILLED_NURSING_FACILITY): Payer: PPO | Admitting: Internal Medicine

## 2017-05-25 DIAGNOSIS — R110 Nausea: Secondary | ICD-10-CM | POA: Diagnosis not present

## 2017-05-25 DIAGNOSIS — R531 Weakness: Secondary | ICD-10-CM

## 2017-05-25 DIAGNOSIS — R0600 Dyspnea, unspecified: Secondary | ICD-10-CM | POA: Diagnosis not present

## 2017-05-25 DIAGNOSIS — R339 Retention of urine, unspecified: Secondary | ICD-10-CM

## 2017-05-25 DIAGNOSIS — K219 Gastro-esophageal reflux disease without esophagitis: Secondary | ICD-10-CM

## 2017-05-25 DIAGNOSIS — R600 Localized edema: Secondary | ICD-10-CM

## 2017-05-25 DIAGNOSIS — E039 Hypothyroidism, unspecified: Secondary | ICD-10-CM | POA: Diagnosis not present

## 2017-05-25 DIAGNOSIS — C541 Malignant neoplasm of endometrium: Secondary | ICD-10-CM

## 2017-05-25 DIAGNOSIS — G4700 Insomnia, unspecified: Secondary | ICD-10-CM

## 2017-05-25 DIAGNOSIS — R627 Adult failure to thrive: Secondary | ICD-10-CM

## 2017-05-25 DIAGNOSIS — F4321 Adjustment disorder with depressed mood: Secondary | ICD-10-CM | POA: Diagnosis not present

## 2017-05-25 DIAGNOSIS — N3000 Acute cystitis without hematuria: Secondary | ICD-10-CM | POA: Diagnosis not present

## 2017-05-25 DIAGNOSIS — M8580 Other specified disorders of bone density and structure, unspecified site: Secondary | ICD-10-CM

## 2017-05-25 DIAGNOSIS — K5909 Other constipation: Secondary | ICD-10-CM

## 2017-05-25 NOTE — Progress Notes (Addendum)
[facility]  Provider: Blanchie Serve, MD  Code Status:  DNR  Goals of Care:  Advanced Directives 05/25/2017  Does Patient Have a Medical Advance Directive? Yes  Type of Advance Directive Out of facility DNR (pink MOST or yellow form)  Does patient want to make changes to medical advance directive? No - Patient declined  Copy of [place] in Chart? -     Chief Complaint  Patient presents with  . New Admit To SNF    New Admission Visit     HPI: Patient is a 83 y.o. female seen today for new admission visit. She was residing in independent living home and with her condition declining and need for increased assistance with her ADLs, she has been admitted to SNF for further care. 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 She has ongoing swelling to her legs. She feels weak and is easily fatigued. She is to be followed by hospice services at the facility. 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   Past Medical History:  Diagnosis Date  . Anginal pain (HCC)    was not cardiac related  . Anxiety   . Arthritis   . Cancer [facility])    endometrial cancer  . Cataract    Both eyes  . Complication of anesthesia    states that anesthesia makes her groggy for about six weeks after surgery.  Hx. of polio   . Costochondritis   . History of radiation therapy 09/18/16-10/21/16   pelvis 43.2 Gy in 24 fractions  . Hypothyroid   . Muscle fatigue    Post polio  . Parasomnia    Night terrors  . Polio    At age 38 or 5 after a tonsillectomy    Past Surgical History:  Procedure Laterality Date  . ABDOMINAL HYSTERECTOMY    . BREAST SURGERY     BENIGN NODES  . CATARACT EXTRACTION    . EYE SURGERY Bilateral Lt 11/12   Rt 12/12   CATARACT  . 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 FOOT SURGERY  1938  . LEG SURGERY Left 1988  . LYMPH NODE BIOPSY N/A 05/06/2016   Procedure:  Sentinel LYMPH NODE BIOPSY right total lymphandenapathy;  Surgeon: Everitt Amber, MD;  Location: [facility];  Service: Gynecology;  Laterality: N/A;  . ROBOTIC ASSISTED TOTAL HYSTERECTOMY WITH BILATERAL SALPINGO OOPHERECTOMY N/A 05/06/2016   Procedure: XI ROBOTIC ASSISTED TOTAL HYSTERECTOMY WITH BILATERAL SALPINGO OOPHORECTOMY;  Surgeon: Everitt Amber, MD;  Location: [facility];  Service: Gynecology;  Laterality: N/A;  . TONSILLECTOMY      Allergies  Allergen Reactions  . Aspirin Anaphylaxis and Other (See Comments)    STOMACH PAIN/BLEEDING ULCERS  . Advil [Ibuprofen]   . E-Mycin [Erythromycin] Other (See Comments)    Stomach problems    Allergies as of 05/25/2017      Reactions   Aspirin Anaphylaxis, Other (See Comments)   STOMACH PAIN/BLEEDING ULCERS   Advil [ibuprofen]    E-mycin [erythromycin] Other (See Comments)   Stomach problems      Medication List       Accurate as of 05/25/17 12:42 PM. Always use your most recent med list.          calcium carbonate 500 MG chewable tablet Commonly known as:  TUMS - dosed in mg elemental calcium Chew 1 tablet by mouth daily as needed for  indigestion or heartburn. Takes with pain medication if she hasn't eaten anything.   CELEXA 20 MG tablet Generic drug:  citalopram Take 20 mg by mouth daily.   cholecalciferol 1000 units tablet Commonly known as:  VITAMIN D Take 2,000 Units by mouth daily.   docusate sodium 100 MG capsule Commonly known as:  COLACE Take 100 mg by mouth daily as needed for mild constipation.   levothyroxine 100 MCG tablet Commonly known as:  SYNTHROID, LEVOTHROID Take 100 mcg by mouth daily before breakfast.   lidocaine 5 % Commonly known as:  LIDODERM Place 1 patch onto the skin daily. Remove & Discard patch within 12 hours or as directed by MD   LORazepam 0.5 MG tablet Commonly known as:  ATIVAN Take 0.5 mg by mouth 2 (two) times daily.   mirtazapine 7.5 MG tablet Commonly known as:  REMERON Take 7.5 mg by mouth  at bedtime.   MORPHINE SULFATE ER PO Take by mouth every 4 (four) hours as needed. Give 0.25 mL (5 mg) sublingual   nitrofurantoin 100 MG capsule Commonly known as:  MACRODANTIN Take 100 mg by mouth 2 (two) times daily. Stop date 05/25/17   polyethylene glycol packet Commonly known as:  MIRALAX / GLYCOLAX Take 17 g by mouth daily as needed.   prochlorperazine 10 MG tablet Commonly known as:  COMPAZINE Take 10 mg by mouth every 4 (four) hours as needed for nausea or vomiting.   ranitidine 150 MG tablet Commonly known as:  ZANTAC Take 150 mg by mouth at bedtime.   zolpidem 5 MG tablet Commonly known as:  AMBIEN Take 5 mg by mouth at bedtime. 5 mg as needed in addition to the scheduled 5 mg qhs       Review of Systems:  Review of Systems  Constitutional: Positive for appetite change and fatigue. Negative for chills, diaphoresis and fever.       Has poor appetite. Gets tired easily  HENT: Positive for mouth sores and postnasal drip. Negative for congestion, ear pain, nosebleeds, sore throat, tinnitus and trouble swallowing.   Eyes: Negative for pain and visual disturbance.       Has reading glasses  Respiratory: Positive for shortness of breath. Negative for cough and wheezing.        With minimal exertion  Cardiovascular: Positive for leg swelling. Negative for chest pain and palpitations.       Increased leg swelling  Gastrointestinal: Positive for abdominal distention, abdominal pain, constipation and nausea. Negative for vomiting.       She had a bowel movement this am. Has history of hemorrhoids  Endocrine: Positive for cold intolerance.  Genitourinary: Positive for decreased urine volume, difficulty urinating and dysuria. Negative for flank pain, hematuria and vaginal bleeding.       Has foley catheter in place due to urinary retention  Musculoskeletal: Positive for gait problem.       Using wheelchair. Left hip pain radiating to her groin at times  Skin: Positive for  pallor. Negative for wound.  Neurological: Positive for weakness. Negative for dizziness, tremors, numbness and headaches.  Hematological: Bruises/bleeds easily.  Psychiatric/Behavioral: Negative for behavioral problems. The patient is nervous/anxious.        Has been depressed with her current health situation    Health Maintenance  Topic Date Due  . DEXA SCAN  05/12/1995  . INFLUENZA VACCINE  07/01/2017  . TETANUS/TDAP  03/23/2023  . PNA vac Low Risk Adult  Completed    Physical Exam:  Vitals:   05/25/17 1212  BP: (!) 156/80  Pulse: 78  Resp: 20  Temp: 98.2 F (36.8 C)  TempSrc: Oral  SpO2: 97%  Weight: 160 lb (72.6 kg)  Height: 5\' 6"  (1.676 m)   Body mass index is 25.82 kg/m. Physical Exam  Constitutional: She is oriented to person, place, and time. She appears distressed.  Frail, elderly female  HENT:  Head: Normocephalic and atraumatic.  Moist mucus membrane, no oropharyngeal erythema, has open sore to left buccal mucosa, no signs of infection  Eyes: Conjunctivae and EOM are normal. Pupils are equal, round, and reactive to light. Right eye exhibits no discharge. Left eye exhibits no discharge.  Neck: Normal range of motion. Neck supple.  Cardiovascular: Normal rate and regular rhythm.   Pulmonary/Chest: Effort normal and breath sounds normal. No respiratory distress. She has no wheezes. She has no rales.  Abdominal: Soft. Bowel sounds are normal. She exhibits distension. There is tenderness. There is no rebound and no guarding.  Has foley catheter with minimal urine  Musculoskeletal:  On wheelchair, can move all 4 extremities, generalized weakness, 2+ leg edema, needs 2 people maximum assist with transfers  Lymphadenopathy:    She has no cervical adenopathy.  Neurological: She is alert and oriented to person, place, and time.  Skin: Skin is warm and dry. She is not diaphoretic. There is pallor.  Psychiatric: She has a normal mood and affect.    Labs  reviewed: Basic Metabolic Panel:  Recent Labs  10/06/16 1318 02/05/17 1506 05/01/17 0942  NA 141 137 129*  K 4.1 4.2 5.0  CL  --   --  97*  CO2 28 28 23   GLUCOSE 112 95 100*  BUN 18.7 18.0 30*  CREATININE 1.0 1.0 1.59*  CALCIUM 9.1 9.6 9.1   Liver Function Tests:  Recent Labs  10/06/16 1318 02/05/17 1506 05/01/17 0942  AST 14 34 23  ALT 14 64* 21  ALKPHOS 74 109 96  BILITOT 0.45 0.42 0.9  PROT 6.5 7.0 5.5*  ALBUMIN 3.6 4.0 2.8*   No results for input(s): LIPASE, AMYLASE in the last 8760 hours. No results for input(s): AMMONIA in the last 8760 hours. CBC:  Recent Labs  10/06/16 1318 02/05/17 1506 05/01/17 0942  WBC 3.0* 4.5 9.9  NEUTROABS 2.1 3.5 8.1*  HGB 12.8 14.0 12.6  HCT 39.3 41.9 38.1  MCV 94.7 90.3 89.4  PLT 135* 179 162   Lipid Panel: No results for input(s): CHOL, HDL, LDLCALC, TRIG, CHOLHDL, LDLDIRECT in the last 8760 hours. No results found for: HGBA1C  Procedures since last visit: Dg Chest 2 View  Result Date: 05/01/2017 CLINICAL DATA:  Chest and anterior LEFT hip pain for a few weeks worse today, history endometrial cancer metastatic to bone EXAM: CHEST  2 VIEW COMPARISON:  05/05/2016, interval CT chest 05/22/2016 FINDINGS: Slightly rotated to the RIGHT. Normal heart size, mediastinal contours, and pulmonary vascularity. Atherosclerotic calcification aorta. Mild RIGHT basilar atelectasis. Lungs otherwise clear. No pulmonary infiltrate, pleural effusion or pneumothorax. Bones appear demineralized. IMPRESSION: RIGHT basilar atelectasis. Aortic atherosclerosis. Electronically Signed   By: Lavonia Dana M.D.   On: 05/01/2017 10:56   Dg Hip Unilat W Or Wo Pelvis 2-3 Views Left  Result Date: 05/01/2017 CLINICAL DATA:  Chest and anterior LEFT hip pain for few weeks worse today, history of endometrial cancer metastatic to bone EXAM: DG HIP (WITH OR WITHOUT PELVIS) 2-3V LEFT COMPARISON:  None FINDINGS: Osseous demineralization. Hip and SI joint spaces  preserved.  Minimal osteitis pubis. Question small bone island RIGHT supra-acetabular. No acute fracture, dislocation, or bone destruction. IMPRESSION: No acute osseous abnormalities. Electronically Signed   By: Lavonia Dana M.D.   On: 05/01/2017 10:57   Urinalysis 05/22/17 Color- dark yellow, appearance- cloudy, occult blood- 3+, protein- 2+, nitrite- positive, leukocyte esterase- 2+, wbc- 10-20, rbc- 3-10, squamous epithelial cells- 0-5, bacteria- moderate, hyaline cast- none seen  Assessment/Plan  Generalized weakness With deconditioning from her endometrial cancer with metastases and recent UTI. Goal is for comfort care. Make changes to medications as below. Hospice service to follow.  UTI Continue and complete nitrofurantoin 100 mg bid course, maintain perineal hygiene and hydration. Monitor urine output.   Urinary retention Has foley catheter in place. Monitor urine output.  Endometrial cancer With metastases. Not a surgical candidate. With provide comfort care with hospice services. With her pain not being controlled with norco, this has been discontinued and she is now on morphine, will change this to q2h prn pain and dyspnea  Dyspnea With deconditioning. Change dosing of morphine as above and monitor. Add o2 2l/min for comfort.   Leg edema with lymphedema from her cancer. She does not want medications to help with leg swelling as she does not want to swallow pills if possible. Keep legs elevated at rest, monitor for skin breakdown, provide skin care  gerd Continue ranitidine 150 mg daily  Nausea D/c compazine, start zofran 4 mg q6h prn  Failure to thrive Minimal po intake at present, she would like to continue her remeron, continue this  Hypothyroidism Continue levothyroxine 100 mcg daily  Depression and anxiety Situational. Continue remeron and celexa for now with her scheduled ativan  Insomnia Continue her ambien  Osteopenia D/c vitamin d  supplement  constipation With her on pain meds, continue miralax and colace, hold for loose stool  Mouth sores Has mucositis with open sore to left buccal mucosa, oral hygiene post meal, chlorhexidine swish and spit tid, oragel tid and prn for now. No signs of infection at present.   Labs- none  Blanchie Serve, MD Internal Medicine [facility] [address] [place], [place] [address] Cell Phone (Monday-Friday 8 am - 5 pm): [phone] On Call: [phone] and follow prompts after 5 pm and on weekends Office Phone: [phone] Office Fax: [phone]

## 2017-05-26 ENCOUNTER — Encounter: Payer: PPO | Admitting: Internal Medicine

## 2017-05-31 DEATH — deceased

## 2017-06-06 ENCOUNTER — Other Ambulatory Visit: Payer: Self-pay | Admitting: Nurse Practitioner

## 2017-08-05 ENCOUNTER — Encounter: Payer: Self-pay | Admitting: Internal Medicine

## 2017-12-05 IMAGING — CT CT HEAD W/O CM
3 series · 16 of 47 positions shown, 19 images · non-contrast
Comparison: MRI of the brain dated 02/10/2015.

CLINICAL DATA: 88 y/o F; history of uterine cancer [DATE]. There are
single of the left temporal area with headaches.

EXAM:
CT HEAD WITHOUT CONTRAST
TECHNIQUE: Contiguous axial images were obtained from the base of the skull
through the vertex without intravenous contrast.

[Series 2: headseq 4.8 h45s · axial · 0.43mm/px · z∈[+1261,+1378]mm · 10 of 30 slices shown, 13 images]
[im 3/30  brain]
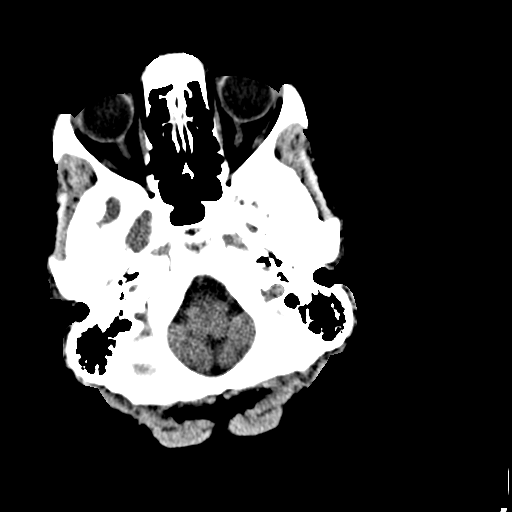
[im 3/30  bone]
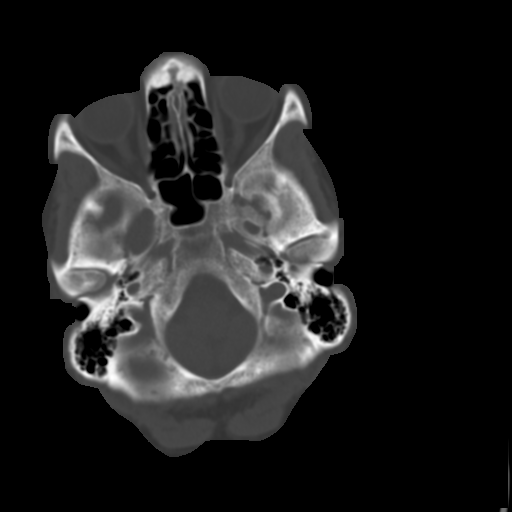
[im 6/30  brain]
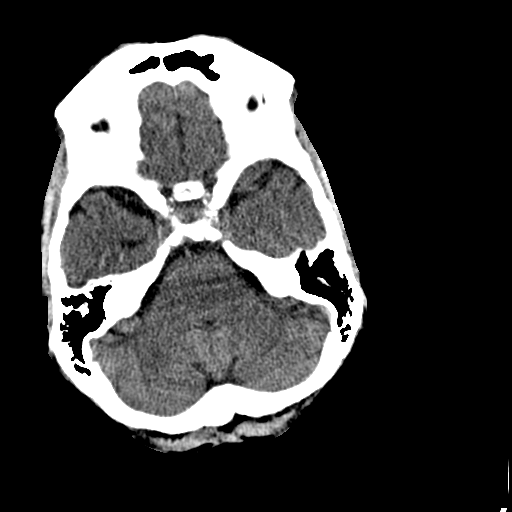
[im 9/30  brain]
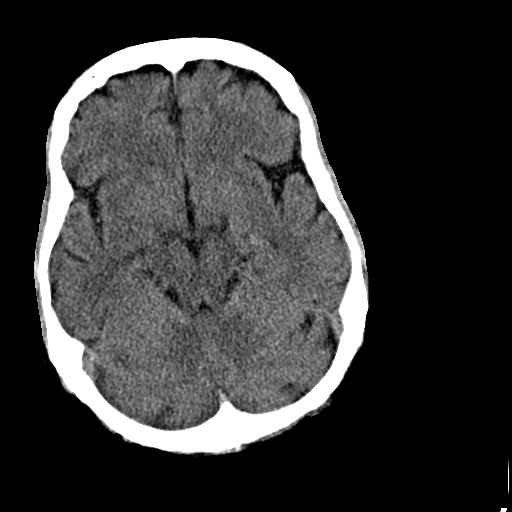
[im 11/30  brain]
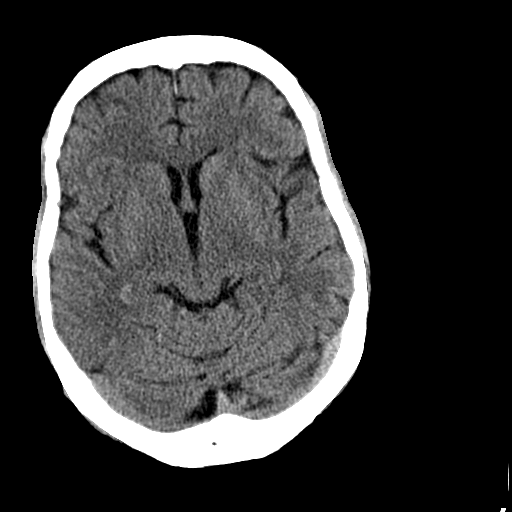
[im 14/30  brain]
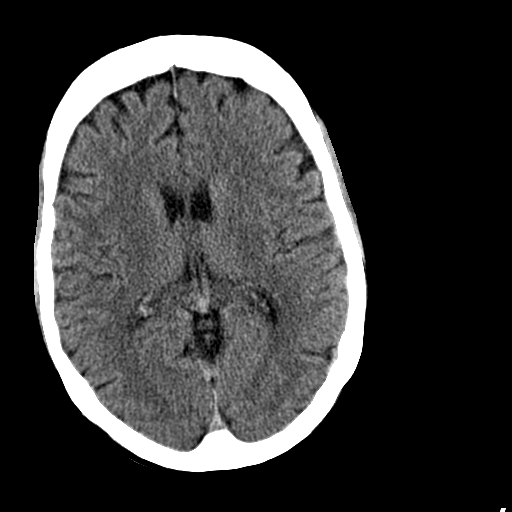
[im 14/30  bone]
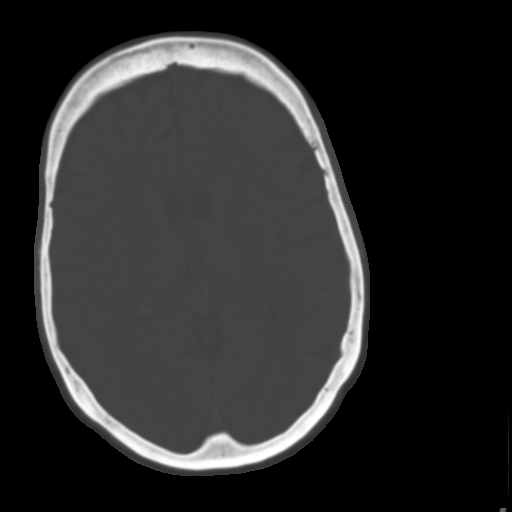
[im 17/30  brain]
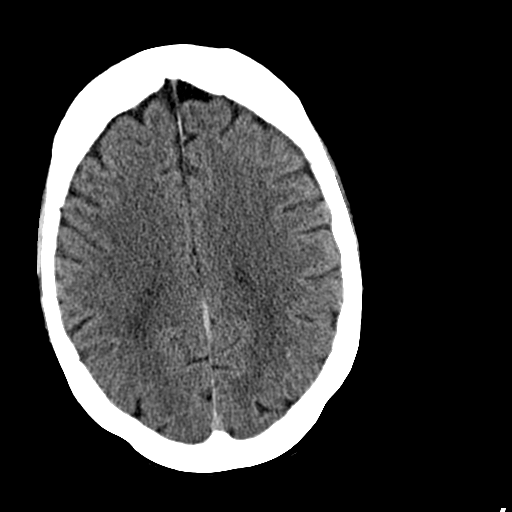
[im 20/30  brain]
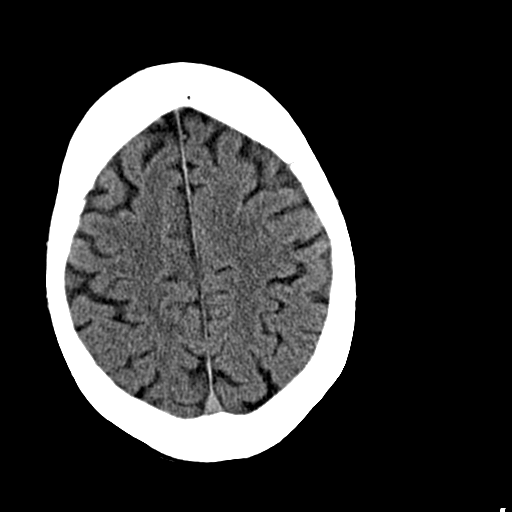
[im 23/30  brain]
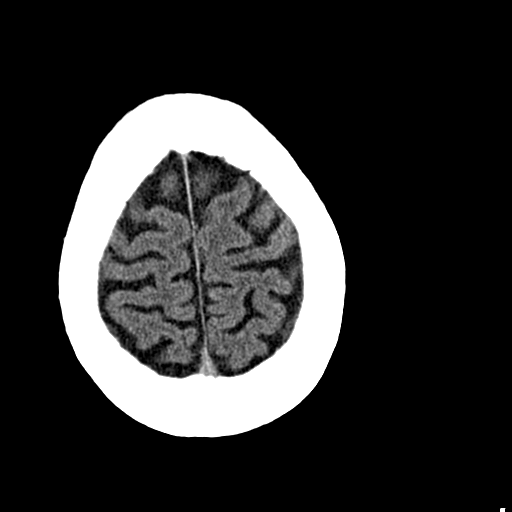
[im 25/30  brain]
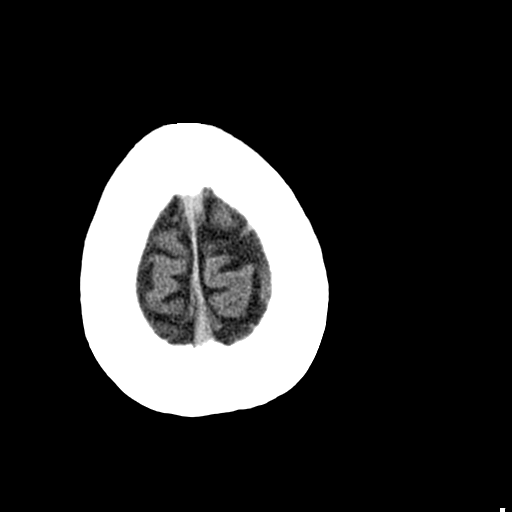
[im 25/30  bone]
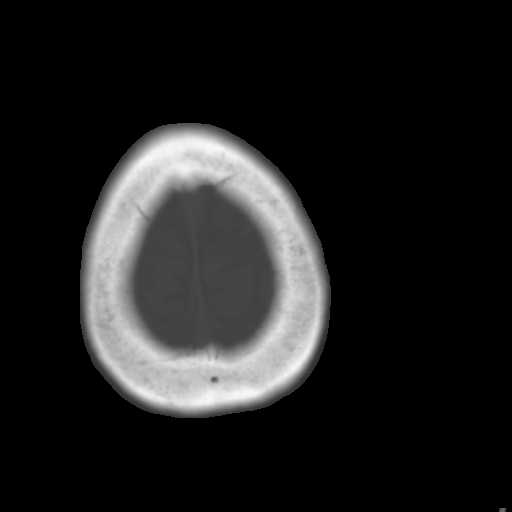
[im 28/30  brain]
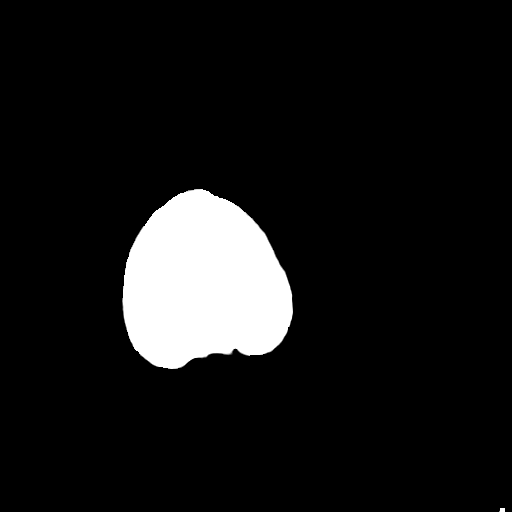

[Series 602: cor · coronal · 0.43mm/px · 3 of 66 slices shown]
[im 22/66  brain]
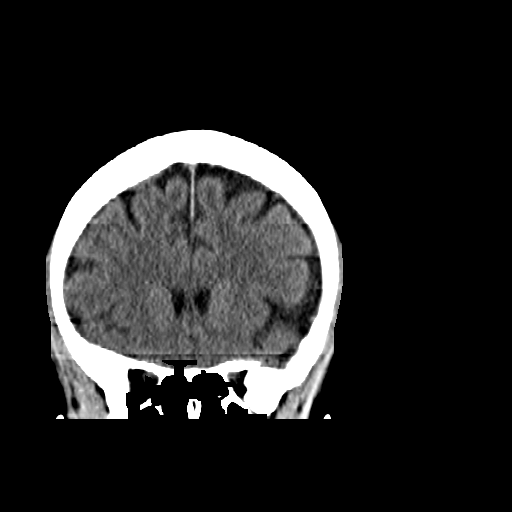
[im 29/66  brain]
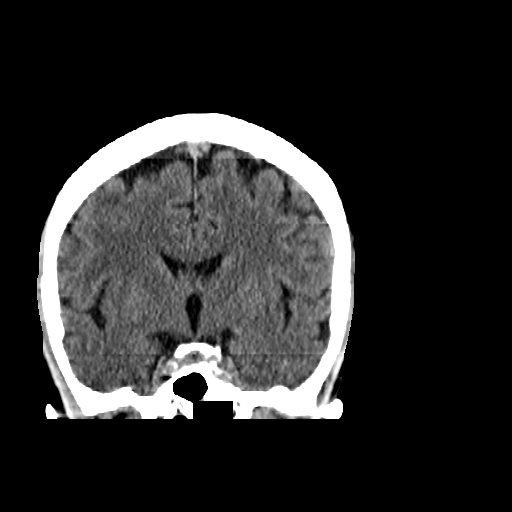
[im 37/66  brain]
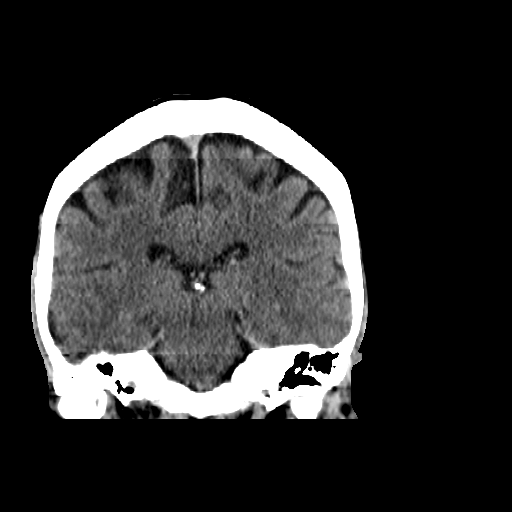

[Series 603: sag · sagittal · 0.43mm/px · 3 of 48 slices shown]
[im 16/48  brain]
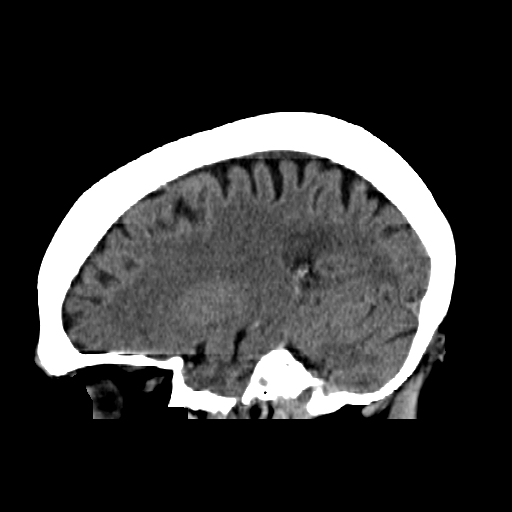
[im 24/48  brain]
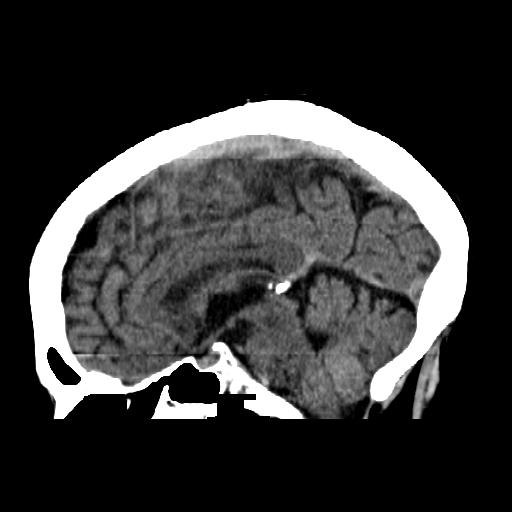
[im 32/48  brain]
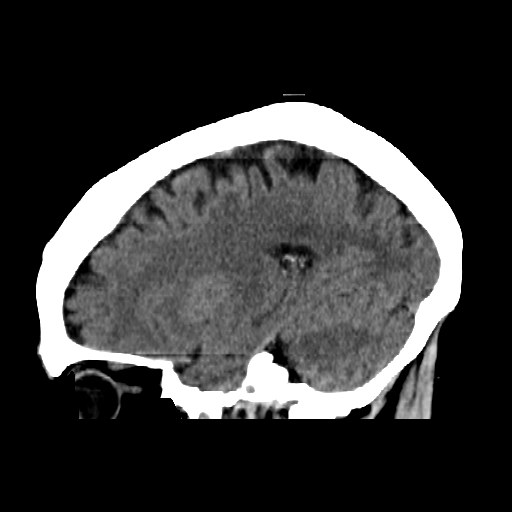

[16 of 47 positions shown; findings below may reference images not displayed]

FINDINGS: Brain: No evidence of large acute infarct, mass effect, intracranial
hemorrhage, or ventriculomegaly. Hypodensity in the right periatrial
white matter corresponding to prior MRI FLAIR signal abnormality
likely represents minimal chronic microvascular ischemic changes.

Vascular: No hyperdense vessel or unexpected calcification.

Skull: Negative for fracture or focal lesion.

Sinuses/Orbits: Bilateral intra-ocular lens replacement. The
visualized paranasal sinuses and mastoids are normally aerated.

Other: No gross soft tissue hematoma of the scalp.
IMPRESSION: 1. No acute intracranial abnormality. Minimal chronic microvascular
ischemic changes.
2. No scalp mass or hematoma is identified.

By: Steinwender Meilinger M.D.

## 2019-01-11 ENCOUNTER — Telehealth: Payer: Self-pay | Admitting: *Deleted

## 2019-01-11 NOTE — Telephone Encounter (Signed)
Medical records faxed to [place]; RI# [phone]
